# Patient Record
Sex: Female | Born: 1995 | Race: Black or African American | Hispanic: No | Marital: Married | Smoking: Never smoker
Health system: Southern US, Community
[De-identification: ages and names within clinical notes are randomized; demographics above are authoritative.]

## PROBLEM LIST (undated history)

## (undated) DIAGNOSIS — Z789 Other specified health status: Secondary | ICD-10-CM

## (undated) DIAGNOSIS — N83209 Unspecified ovarian cyst, unspecified side: Secondary | ICD-10-CM

## (undated) HISTORY — PX: NO PAST SURGERIES: SHX2092

## (undated) HISTORY — DX: Unspecified ovarian cyst, unspecified side: N83.209

---

## 2016-04-05 NOTE — L&D Delivery Note (Signed)
Delivery Note At 12:36 AM a viable female was delivered via  (Presentation: DOA;  ).  APGAR: , ; weight  . pnding  Placenta status: ,ntact, spontaneous .  Cord:  with the following complications: .none  Cord pH: n/a  Anesthesia:  epidural Episiotomy:  none Lacerations:  none Suture Repair: n/a Est. Blood Loss (mL):  250cc  Mom to postpartum.  Baby to Couplet care / Skin to Skin.  Cassidy Tashiro A. 02/04/2017, 12:51 AM

## 2016-06-21 ENCOUNTER — Ambulatory Visit (INDEPENDENT_AMBULATORY_CARE_PROVIDER_SITE_OTHER): Payer: 59 | Admitting: Emergency Medicine

## 2016-06-21 VITALS — BP 135/84 | HR 89 | Temp 99.2°F | Ht 66.0 in | Wt 115.6 lb

## 2016-06-21 DIAGNOSIS — Z3201 Encounter for pregnancy test, result positive: Secondary | ICD-10-CM | POA: Insufficient documentation

## 2016-06-21 DIAGNOSIS — R112 Nausea with vomiting, unspecified: Secondary | ICD-10-CM | POA: Diagnosis not present

## 2016-06-21 DIAGNOSIS — O210 Mild hyperemesis gravidarum: Secondary | ICD-10-CM | POA: Diagnosis not present

## 2016-06-21 DIAGNOSIS — K08409 Partial loss of teeth, unspecified cause, unspecified class: Secondary | ICD-10-CM

## 2016-06-21 DIAGNOSIS — K0889 Other specified disorders of teeth and supporting structures: Secondary | ICD-10-CM | POA: Diagnosis not present

## 2016-06-21 LAB — POCT URINALYSIS DIP (MANUAL ENTRY)
Bilirubin, UA: NEGATIVE
Blood, UA: NEGATIVE
GLUCOSE UA: NEGATIVE
Nitrite, UA: NEGATIVE
SPEC GRAV UA: 1.015 (ref 1.030–1.035)
UROBILINOGEN UA: 0.2 (ref ?–2.0)
pH, UA: 7 (ref 5.0–8.0)

## 2016-06-21 LAB — POCT URINE PREGNANCY: PREG TEST UR: POSITIVE — AB

## 2016-06-21 MED ORDER — ONDANSETRON HCL 4 MG PO TABS: 4.0000 mg | ORAL_TABLET | Freq: Three times a day (TID) | ORAL | 0 refills | Status: DC | PRN

## 2016-06-21 MED ORDER — AMOXICILLIN 500 MG PO CAPS: 500.0000 mg | ORAL_CAPSULE | Freq: Three times a day (TID) | ORAL | 0 refills | Status: AC

## 2016-06-21 MED ORDER — ONDANSETRON 4 MG PO TBDP
4.0000 mg | ORAL_TABLET | Freq: Once | ORAL | Status: AC
  Administered 2016-06-21: 4 mg via ORAL

## 2016-06-21 NOTE — Progress Notes (Signed)
Marisa Abdoul [name] 20 y.o.   Chief Complaint  Patient presents with  . Pregnant Test    X 1 week oh nausea, and vomiting  . Dental Pain    X 4-5 days left side with headache    HISTORY OF PRESENT ILLNESS: This is a 21 y.o. female complaining of 1 week h/o nausea and vomiting, may be pregnant; also had dental extraction several days ago and now c/o pain to left lower site. Sharp constant pain with radiation to left neck.  HPI   Prior to Admission medications   Medication Sig Start Date End Date Taking? Authorizing Provider  acetaminophen-codeine (TYLENOL #3) 300-30 MG tablet Take by mouth every 4 (four) hours as needed for moderate pain.   Yes Historical Provider, MD  ondansetron (ZOFRAN) 4 MG tablet Take 1 tablet (4 mg total) by mouth every 8 (eight) hours as needed for nausea or vomiting. 06/21/16   Selia Wareing Victorino December, MD    No Known Allergies  There are no active problems to display for this patient.   History reviewed. No pertinent past medical history.  History reviewed. No pertinent surgical history.  Social History   Social History  . Marital status: Married    Spouse name: N/A  . Number of children: N/A  . Years of education: N/A   Occupational History  . Not on file.   Social History Main Topics  . Smoking status: Never Smoker  . Smokeless tobacco: Never Used  . Alcohol use No  . Drug use: No  . Sexual activity: Not on file   Other Topics Concern  . Not on file   Social History Narrative  . No narrative on file    Family History  Problem Relation Age of Onset  . Hypertension Maternal Grandmother   . Hypertension Maternal Grandfather      Review of Systems  Constitutional: Negative for chills and fever.  HENT: Positive for ear pain. Negative for congestion, nosebleeds, sinus pain and sore throat.        Toothache  Eyes: Negative for blurred vision, double vision, discharge and redness.  Respiratory: Negative for cough and shortness of  breath.   Cardiovascular: Negative for chest pain and palpitations.  Gastrointestinal: Positive for nausea and vomiting. Negative for abdominal pain, blood in stool and diarrhea.  Genitourinary: Negative for dysuria, frequency and hematuria.  Musculoskeletal: Negative for myalgias and neck pain.  Neurological: Negative for dizziness and headaches.  All other systems reviewed and are negative.   Vitals:   06/21/16 1600  BP: 135/84  Pulse: 89  Temp: 99.2 F (37.3 C)    Physical Exam  Constitutional: She is oriented to person, place, and time. She appears well-developed and well-nourished.  HENT:  Head: Normocephalic and atraumatic.  Right Ear: External ear normal.  Left Ear: External ear normal.  Nose: Nose normal.  Mouth/Throat: Oropharynx is clear and moist.    s/p tooth extraction with signs of infection  Eyes: Conjunctivae and EOM are normal. Pupils are equal, round, and reactive to light.  Neck: Normal range of motion. Neck supple.  Cardiovascular: Normal rate, regular rhythm and normal heart sounds.   Pulmonary/Chest: Effort normal and breath sounds normal.  Abdominal: Soft. Bowel sounds are normal. There is no tenderness.  Musculoskeletal: Normal range of motion.  Lymphadenopathy:    She has cervical adenopathy.  Neurological: She is alert and oriented to person, place, and time. No sensory deficit. She exhibits normal muscle tone.  Skin: Skin is  warm and dry. Capillary refill takes less than 2 seconds. No rash noted.  Psychiatric: She has a normal mood and affect. Her behavior is normal.  Vitals reviewed.    ASSESSMENT & PLAN: Marisa Pearson was seen today for pregnant test and dental pain.  Diagnoses and all orders for this visit:  Nausea and vomiting, intractability of vomiting not specified, unspecified vomiting type -     POCT urinalysis dipstick -     POCT urine pregnancy -     ondansetron (ZOFRAN-ODT) disintegrating tablet 4 mg; Take 1 tablet (4 mg total) by  mouth once.  Toothache  Positive pregnancy test -     Ambulatory referral to Obstetrics / Gynecology  Status post tooth extraction  Hyperemesis gravidarum -     Ambulatory referral to Obstetrics / Gynecology  Other orders -     ondansetron (ZOFRAN) 4 MG tablet; Take 1 tablet (4 mg total) by mouth every 8 (eight) hours as needed for nausea or vomiting. -     amoxicillin (AMOXIL) 500 MG capsule; Take 1 capsule (500 mg total) by mouth 3 (three) times daily.    Patient Instructions       IF you received an x-ray today, you will receive an invoice from [facility]. Please contact [facility] at [phone] with questions or concerns regarding your invoice.   IF you received labwork today, you will receive an invoice from [facility]. Please contact [facility] at [phone] with questions or concerns regarding your invoice.   Our billing staff will not be able to assist you with questions regarding bills from these companies.  You will be contacted with the lab results as soon as they are available. The fastest way to get your results is to activate your My Chart account. Instructions are located on the last page of this paperwork. If you have not heard from [place] regarding the results in 2 weeks, please contact this office.      Dental Pain Dental pain may be caused by many things, including:  Tooth decay (cavities or caries). Cavities cause the nerve of your tooth to be open to air and hot or cold temperatures. This can cause pain or discomfort.  Abscess or infection. A dental abscess is an area that is full of infected pus from a bacterial infection in the inner part of the tooth (pulp). It usually happens at the end of the tooth's root.  Injury.  An unknown reason (idiopathic). Your pain may be mild or severe. It may only happen when:  You are chewing.  You are exposed to hot or cold temperature.  You are eating or drinking sugary foods or beverages, such  as:  Soda.  Candy. Your pain may also be there all of the time. Follow these instructions at home: Watch your dental pain for any changes. Do these things to lessen your discomfort:  Take medicines only as told by your dentist.  If your dentist tells you to take an antibiotic medicine, finish all of it even if you start to feel better.  Keep all follow-up visits as told by your dentist. This is important.  Do not apply heat to the outside of your face.  Rinse your mouth or gargle with salt water if told by your dentist. This helps with pain and swelling.  You can make salt water by adding  tsp of salt to 1 cup of warm water.  Apply ice to the painful area of your face:  Put ice in a plastic  bag.  Place a towel between your skin and the bag.  Leave the ice on for 20 minutes, 2-3 times per day.  Avoid foods or drinks that cause you pain, such as:  Very hot or very cold foods or drinks.  Sweet or sugary foods or drinks. Contact a doctor if:  Your pain is not helped with medicines.  Your symptoms are worse.  You have new symptoms. Get help right away if:  You cannot open your mouth.  You are having trouble breathing or swallowing.  You have a fever.  Your face, neck, or jaw is puffy (swollen). This information is not intended to replace advice given to you by your health care provider. Make sure you discuss any questions you have with your health care provider. Document Released: 09/08/2007 Document Revised: 08/28/2015 Document Reviewed: 03/18/2014 Elsevier Interactive Patient Education  2017 Elsevier Inc.  Hyperemesis Gravidarum Hyperemesis gravidarum is a severe form of nausea and vomiting that happens during pregnancy. Hyperemesis is worse than morning sickness. It may cause you to have nausea or vomiting all day for many days. It may keep you from eating and drinking enough food and liquids. Hyperemesis usually occurs during the first half (the first 20 weeks)  of pregnancy. It often goes away once a woman is in her second half of pregnancy. However, sometimes hyperemesis continues through an entire pregnancy. What are the causes? The cause of this condition is not known. It may be related to changes in chemicals (hormones) in the body during pregnancy, such as the high level of pregnancy hormone (human chorionic gonadotropin) or the increase in the female sex hormone (estrogen). What are the signs or symptoms? Symptoms of this condition include:  Severe nausea and vomiting.  Nausea that does not go away.  Vomiting that does not allow you to keep any food down.  Weight loss.  Body fluid loss (dehydration).  Having no desire to eat, or not liking food that you have previously enjoyed. How is this diagnosed? This condition may be diagnosed based on:  A physical exam.  Your medical history.  Your symptoms.  Blood tests.  Urine tests. How is this treated? This condition may be managed with medicine. If medicines to do not help relieve nausea and vomiting, you may need to receive fluids through an IV tube at the hospital. Follow these instructions at home:  Take over-the-counter and prescription medicines only as told by your health care provider.  Avoid iron pills and multivitamins that contain iron for the first 3-4 months of pregnancy. If you take prescription iron pills, do not stop taking them unless your health care provider approves.  Take the following actions to help prevent nausea and vomiting:  In the morning, before getting out of bed, try eating a couple of dry crackers or a piece of toast.  Avoid foods and smells that upset your stomach. Fatty and spicy foods may make nausea worse.  Eat 5-6 small meals a day.  Do not drink fluids while eating meals. Drink between meals.  Eat or suck on things that have ginger in them. Ginger can help relieve nausea.  Avoid food preparation. The smell of food can spoil your appetite  or trigger nausea.  Follow instructions from your health care provider about eating or drinking restrictions.  For snacks, eat high-protein foods, such as cheese.  Keep all follow-up and pre-birth (prenatal) visits as told by your health care provider. This is important. Contact a health care provider if:  You have pain in your abdomen.  You have a severe headache.  You have vision problems.  You are losing weight. Get help right away if:  You cannot drink fluids without vomiting.  You vomit blood.  You have constant nausea and vomiting.  You are very weak.  You are very thirsty.  You feel dizzy.  You faint.  You have a fever or other symptoms that last for more than 2-3 days.  You have a fever and your symptoms suddenly get worse. Summary  Hyperemesis gravidarum is a severe form of nausea and vomiting that happens during pregnancy.  Making some changes to your eating habits may help relieve nausea and vomiting.  This condition may be managed with medicine.  If medicines to do not help relieve nausea and vomiting, you may need to receive fluids through an IV tube at the hospital. This information is not intended to replace advice given to you by your health care provider. Make sure you discuss any questions you have with your health care provider. Document Released: 03/22/2005 Document Revised: 11/19/2015 Document Reviewed: 11/19/2015 Elsevier Interactive Patient Education  2017 Elsevier Inc.      Edwina Barth, MD [facility]

## 2016-06-21 NOTE — Patient Instructions (Addendum)
IF you received an x-ray today, you will receive an invoice from [facility]. Please contact [facility] at [phone] with questions or concerns regarding your invoice.   IF you received labwork today, you will receive an invoice from [facility]. Please contact [facility] at [phone] with questions or concerns regarding your invoice.   Our billing staff will not be able to assist you with questions regarding bills from these companies.  You will be contacted with the lab results as soon as they are available. The fastest way to get your results is to activate your My Chart account. Instructions are located on the last page of this paperwork. If you have not heard from [place] regarding the results in 2 weeks, please contact this office.      Dental Pain Dental pain may be caused by many things, including:  Tooth decay (cavities or caries). Cavities cause the nerve of your tooth to be open to air and hot or cold temperatures. This can cause pain or discomfort.  Abscess or infection. A dental abscess is an area that is full of infected pus from a bacterial infection in the inner part of the tooth (pulp). It usually happens at the end of the tooth's root.  Injury.  An unknown reason (idiopathic). Your pain may be mild or severe. It may only happen when:  You are chewing.  You are exposed to hot or cold temperature.  You are eating or drinking sugary foods or beverages, such as:  Soda.  Candy. Your pain may also be there all of the time. Follow these instructions at home: Watch your dental pain for any changes. Do these things to lessen your discomfort:  Take medicines only as told by your dentist.  If your dentist tells you to take an antibiotic medicine, finish all of it even if you start to feel better.  Keep all follow-up visits as told by your dentist. This is important.  Do not apply heat to the outside of your face.  Rinse your mouth or gargle  with salt water if told by your dentist. This helps with pain and swelling.  You can make salt water by adding  tsp of salt to 1 cup of warm water.  Apply ice to the painful area of your face:  Put ice in a plastic bag.  Place a towel between your skin and the bag.  Leave the ice on for 20 minutes, 2-3 times per day.  Avoid foods or drinks that cause you pain, such as:  Very hot or very cold foods or drinks.  Sweet or sugary foods or drinks. Contact a doctor if:  Your pain is not helped with medicines.  Your symptoms are worse.  You have new symptoms. Get help right away if:  You cannot open your mouth.  You are having trouble breathing or swallowing.  You have a fever.  Your face, neck, or jaw is puffy (swollen). This information is not intended to replace advice given to you by your health care provider. Make sure you discuss any questions you have with your health care provider. Document Released: 09/08/2007 Document Revised: 08/28/2015 Document Reviewed: 03/18/2014 Elsevier Interactive Patient Education  2017 Elsevier Inc.  Hyperemesis Gravidarum Hyperemesis gravidarum is a severe form of nausea and vomiting that happens during pregnancy. Hyperemesis is worse than morning sickness. It may cause you to have nausea or vomiting all day for many days. It may keep you from eating and drinking enough food and  liquids. Hyperemesis usually occurs during the first half (the first 20 weeks) of pregnancy. It often goes away once a woman is in her second half of pregnancy. However, sometimes hyperemesis continues through an entire pregnancy. What are the causes? The cause of this condition is not known. It may be related to changes in chemicals (hormones) in the body during pregnancy, such as the high level of pregnancy hormone (human chorionic gonadotropin) or the increase in the female sex hormone (estrogen). What are the signs or symptoms? Symptoms of this condition  include:  Severe nausea and vomiting.  Nausea that does not go away.  Vomiting that does not allow you to keep any food down.  Weight loss.  Body fluid loss (dehydration).  Having no desire to eat, or not liking food that you have previously enjoyed. How is this diagnosed? This condition may be diagnosed based on:  A physical exam.  Your medical history.  Your symptoms.  Blood tests.  Urine tests. How is this treated? This condition may be managed with medicine. If medicines to do not help relieve nausea and vomiting, you may need to receive fluids through an IV tube at the hospital. Follow these instructions at home:  Take over-the-counter and prescription medicines only as told by your health care provider.  Avoid iron pills and multivitamins that contain iron for the first 3-4 months of pregnancy. If you take prescription iron pills, do not stop taking them unless your health care provider approves.  Take the following actions to help prevent nausea and vomiting:  In the morning, before getting out of bed, try eating a couple of dry crackers or a piece of toast.  Avoid foods and smells that upset your stomach. Fatty and spicy foods may make nausea worse.  Eat 5-6 small meals a day.  Do not drink fluids while eating meals. Drink between meals.  Eat or suck on things that have ginger in them. Ginger can help relieve nausea.  Avoid food preparation. The smell of food can spoil your appetite or trigger nausea.  Follow instructions from your health care provider about eating or drinking restrictions.  For snacks, eat high-protein foods, such as cheese.  Keep all follow-up and pre-birth (prenatal) visits as told by your health care provider. This is important. Contact a health care provider if:  You have pain in your abdomen.  You have a severe headache.  You have vision problems.  You are losing weight. Get help right away if:  You cannot drink fluids  without vomiting.  You vomit blood.  You have constant nausea and vomiting.  You are very weak.  You are very thirsty.  You feel dizzy.  You faint.  You have a fever or other symptoms that last for more than 2-3 days.  You have a fever and your symptoms suddenly get worse. Summary  Hyperemesis gravidarum is a severe form of nausea and vomiting that happens during pregnancy.  Making some changes to your eating habits may help relieve nausea and vomiting.  This condition may be managed with medicine.  If medicines to do not help relieve nausea and vomiting, you may need to receive fluids through an IV tube at the hospital. This information is not intended to replace advice given to you by your health care provider. Make sure you discuss any questions you have with your health care provider. Document Released: 03/22/2005 Document Revised: 11/19/2015 Document Reviewed: 11/19/2015 Elsevier Interactive Patient Education  2017 [employer]

## 2016-07-22 LAB — OB RESULTS CONSOLE RPR: RPR: NONREACTIVE

## 2016-07-22 LAB — OB RESULTS CONSOLE GC/CHLAMYDIA
Chlamydia: NEGATIVE
Gonorrhea: NEGATIVE

## 2016-07-22 LAB — OB RESULTS CONSOLE ABO/RH: RH TYPE: POSITIVE

## 2016-07-22 LAB — OB RESULTS CONSOLE HEPATITIS B SURFACE ANTIGEN: Hepatitis B Surface Ag: NEGATIVE

## 2016-07-22 LAB — OB RESULTS CONSOLE RUBELLA ANTIBODY, IGM: Rubella: IMMUNE

## 2016-07-22 LAB — OB RESULTS CONSOLE ANTIBODY SCREEN: ANTIBODY SCREEN: NEGATIVE

## 2016-07-22 LAB — OB RESULTS CONSOLE HIV ANTIBODY (ROUTINE TESTING): HIV: NONREACTIVE

## 2016-08-18 ENCOUNTER — Ambulatory Visit (HOSPITAL_COMMUNITY)
Admission: RE | Admit: 2016-08-18 | Discharge: 2016-08-18 | Disposition: A | Discharge status: Home / Self Care | Payer: 59 | Source: Ambulatory Visit | Attending: Obstetrics | Admitting: Obstetrics

## 2016-08-18 DIAGNOSIS — Z3A14 14 weeks gestation of pregnancy: Secondary | ICD-10-CM | POA: Insufficient documentation

## 2016-08-18 DIAGNOSIS — O352XX0 Maternal care for (suspected) hereditary disease in fetus, not applicable or unspecified: Secondary | ICD-10-CM | POA: Insufficient documentation

## 2016-08-18 NOTE — Progress Notes (Signed)
Genetic Counseling  Visit Summary Note  Appointment Date: 08/18/2016 Referred By: Aloha Gell, MD  Date of Birth: 07-22-1995  I met with [name] and her husband, Marisa Pearson, for genetic counseling because of a family history of sickle cell anemia.  Both Mr. Marisa Pearson and the interpreter provided English/French translation.  In summary:  Discussed family history of sickle cell anemia  Reviewed information about sickle cell anemia  Discussed diagnostic testing options  Declined amniocentesis  Reviewed other family history concerns  None reported  Discussed general population carrier screening options - declined  CF  SMA  We began by reviewing the family history in detail. 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  While the laboratory report was not available for her husband's test results, notes from the patient's medical record suggested that her husband was also identified as a carrier for sickle cell anemia.  We discussed that Sickle Cell anemia (SCA) is a hemoglobinopathy in which there is an inherited structural abnormality in one of the globin chains, specifically the beta globin chain.  It is characterized by episodes of vaso-occlusive crisis and chronic anemia due to the tendency of red blood cells to become deformed under conditions of decreased oxygen tension.  The basic defect in SCA involves the change of a single amino acid altering the configuration of the hemoglobin molecule causing the cells to sickle and to obstruct blood flow in small vessels.  Ischemia of tissues and organs results.  Increased cell fragility with increased phagocytosis and splenic sequestration of the fragile cells produces anemia.  SCA (Hg SS) is inherited as an autosomal recessive condition.  The carrier state is Hg AS.    This couple was counseled that every person is a carrier for  approximately 8-10 different recessive conditions.  Typically carriers are not symptomatic and are only identified as such when they have carrier screening or an affected child.  We discussed that when both parents are carriers for sickle cell anemia (HgAS) there is a 1 in 4 (25%) chance with each pregnancy to have a child with sickle cell disease (HgSS).  There is also a 1 in 4 chance to have a child who is not a carrier or affected, and a 1 in 2 chance to have a child who is a carrier like the parents.   We reviewed that early diagnosis of SCA, and other disorders of hemoglobin, are facilitated by newborn screening before the onset of symptoms.  Clinical manifestations usually present in the first or second year of life.  We discussed the option of amniocentesis as a way to determine, prenatally, if a baby has SCA or not, when both parents are known carriers.  A risk of 1 in 300-500 was given for amniocentesis, the primary complication being spontaneous pregnancy loss, or at this late gestation, preterm delivery.  She indicated that she understood these risks but declined amniocentesis. She understands that although the ultrasound may appear normal, the risk of anomalies cannot be completely eliminated, and that a baby with SCA would not appear different on a prenatal ultrasound. The patient was advised of this limitation and states she still does not want the amniocentesis.  The family histories were otherwise found to be noncontributory for birth defects, mental retardation, and known genetic conditions. Without further information regarding the provided family history, an accurate genetic risk cannot be calculated. Further genetic counseling is warranted if more information  is obtained.  [name] was provided with written information regarding cystic fibrosis (CF), spinal muscular atrophy (SMA) and hemoglobinopathies including the carrier frequency, availability of carrier screening and  prenatal diagnosis if indicated.  In addition, we discussed that CF and hemoglobinopathies are routinely screened for as part of the [place] newborn screening panel.  After further discussion, she declined screening for CF and SMA.  [name] denied exposure to environmental toxins or chemical agents. She denied the use of alcohol, tobacco or street drugs. She denied significant viral illnesses during the course of her pregnancy. Her medical and surgical histories were noncontributory.   I counseled this couple regarding the above risks and available options.  The approximate face-to-face time with the genetic counselor was 40 minutes.   Cam Hai, MS Certified Genetic Counselor

## 2016-08-20 ENCOUNTER — Other Ambulatory Visit: Payer: Self-pay

## 2017-01-21 LAB — OB RESULTS CONSOLE GBS: STREP GROUP B AG: NEGATIVE

## 2017-02-03 ENCOUNTER — Encounter (HOSPITAL_COMMUNITY): Payer: Self-pay | Admitting: *Deleted

## 2017-02-03 ENCOUNTER — Inpatient Hospital Stay (HOSPITAL_COMMUNITY)
Admission: AD | Admit: 2017-02-03 | Discharge: 2017-02-06 | DRG: 807 | Disposition: A | Discharge status: Home / Self Care | Payer: 59 | Source: Ambulatory Visit | Attending: Obstetrics | Admitting: Obstetrics

## 2017-02-03 ENCOUNTER — Inpatient Hospital Stay (HOSPITAL_COMMUNITY): Payer: 59 | Admitting: Anesthesiology

## 2017-02-03 DIAGNOSIS — D573 Sickle-cell trait: Secondary | ICD-10-CM | POA: Diagnosis present

## 2017-02-03 DIAGNOSIS — Z3A38 38 weeks gestation of pregnancy: Secondary | ICD-10-CM | POA: Diagnosis not present

## 2017-02-03 DIAGNOSIS — Z3483 Encounter for supervision of other normal pregnancy, third trimester: Secondary | ICD-10-CM | POA: Diagnosis present

## 2017-02-03 HISTORY — DX: Other specified health status: Z78.9

## 2017-02-03 LAB — CBC
HCT: 38.9 % (ref 36.0–46.0)
HEMOGLOBIN: 13.3 g/dL (ref 12.0–15.0)
MCH: 27.3 pg (ref 26.0–34.0)
MCHC: 34.2 g/dL (ref 30.0–36.0)
MCV: 79.7 fL (ref 78.0–100.0)
Platelets: 134 10*3/uL — ABNORMAL LOW (ref 150–400)
RBC: 4.88 MIL/uL (ref 3.87–5.11)
RDW: 14.7 % (ref 11.5–15.5)
WBC: 10 10*3/uL (ref 4.0–10.5)

## 2017-02-03 LAB — ABO/RH: ABO/RH(D): O POS

## 2017-02-03 LAB — TYPE AND SCREEN
ABO/RH(D): O POS
Antibody Screen: NEGATIVE

## 2017-02-03 MED ORDER — LACTATED RINGERS IV SOLN
500.0000 mL | Freq: Once | INTRAVENOUS | Status: AC
  Administered 2017-02-03: 500 mL via INTRAVENOUS

## 2017-02-03 MED ORDER — SOD CITRATE-CITRIC ACID 500-334 MG/5ML PO SOLN: 30.0000 mL | ORAL | Status: DC | PRN

## 2017-02-03 MED ORDER — LIDOCAINE HCL (PF) 1 % IJ SOLN
INTRAMUSCULAR | Status: DC | PRN
  Administered 2017-02-03 (×3): 4 mL via EPIDURAL

## 2017-02-03 MED ORDER — OXYCODONE-ACETAMINOPHEN 5-325 MG PO TABS: 1.0000 | ORAL_TABLET | ORAL | Status: DC | PRN

## 2017-02-03 MED ORDER — LIDOCAINE HCL (PF) 1 % IJ SOLN
30.0000 mL | INTRAMUSCULAR | Status: DC | PRN
  Filled 2017-02-03: qty 30

## 2017-02-03 MED ORDER — DIPHENHYDRAMINE HCL 50 MG/ML IJ SOLN: 12.5000 mg | INTRAMUSCULAR | Status: DC | PRN

## 2017-02-03 MED ORDER — PHENYLEPHRINE 40 MCG/ML (10ML) SYRINGE FOR IV PUSH (FOR BLOOD PRESSURE SUPPORT)
80.0000 ug | PREFILLED_SYRINGE | INTRAVENOUS | Status: DC | PRN
  Filled 2017-02-03: qty 5

## 2017-02-03 MED ORDER — OXYCODONE-ACETAMINOPHEN 5-325 MG PO TABS: 2.0000 | ORAL_TABLET | ORAL | Status: DC | PRN

## 2017-02-03 MED ORDER — OXYTOCIN BOLUS FROM INFUSION
500.0000 mL | Freq: Once | INTRAVENOUS | Status: AC
  Administered 2017-02-04: 500 mL via INTRAVENOUS

## 2017-02-03 MED ORDER — ONDANSETRON HCL 4 MG/2ML IJ SOLN: 4.0000 mg | Freq: Four times a day (QID) | INTRAMUSCULAR | Status: DC | PRN

## 2017-02-03 MED ORDER — EPHEDRINE 5 MG/ML INJ
10.0000 mg | INTRAVENOUS | Status: DC | PRN
  Filled 2017-02-03: qty 2

## 2017-02-03 MED ORDER — OXYTOCIN 40 UNITS IN LACTATED RINGERS INFUSION - SIMPLE MED
2.5000 [IU]/h | INTRAVENOUS | Status: DC
  Filled 2017-02-03: qty 1000

## 2017-02-03 MED ORDER — ACETAMINOPHEN 325 MG PO TABS: 650.0000 mg | ORAL_TABLET | ORAL | Status: DC | PRN

## 2017-02-03 MED ORDER — PHENYLEPHRINE 40 MCG/ML (10ML) SYRINGE FOR IV PUSH (FOR BLOOD PRESSURE SUPPORT)
80.0000 ug | PREFILLED_SYRINGE | INTRAVENOUS | Status: DC | PRN
  Filled 2017-02-03: qty 5
  Filled 2017-02-03: qty 10

## 2017-02-03 MED ORDER — LACTATED RINGERS IV SOLN: 500.0000 mL | INTRAVENOUS | Status: DC | PRN

## 2017-02-03 MED ORDER — LACTATED RINGERS IV SOLN
INTRAVENOUS | Status: DC
  Administered 2017-02-03 (×3): via INTRAVENOUS

## 2017-02-03 MED ORDER — FENTANYL 2.5 MCG/ML BUPIVACAINE 1/10 % EPIDURAL INFUSION (WH - ANES)
14.0000 mL/h | INTRAMUSCULAR | Status: DC | PRN
  Administered 2017-02-03: 14 mL/h via EPIDURAL
  Filled 2017-02-03: qty 100

## 2017-02-03 NOTE — Anesthesia Pain Management Evaluation Note (Signed)
  CRNA Pain Management Visit Note  Patient: Marisa Pearson, 21 y.o., female  "Hello I am a member of the anesthesia team at [facility]. We have an anesthesia team available at all times to provide care throughout the hospital, including epidural management and anesthesia for C-section. I don't know your plan for the delivery whether it a natural birth, water birth, IV sedation, nitrous supplementation, doula or epidural, but we want to meet your pain goals."   1.Was your pain managed to your expectations on prior hospitalizations?   No prior hospitalizations  2.What is your expectation for pain management during this hospitalization?     Labor support without medications  3.How can we help you reach that goal? Be available. Patient desires nature child birth verified by RN  Record the patient's initial score and the patient's pain goal.   Pain: 10  Pain Goal: 10 [facility] wants you to be able to say your pain was always managed very well.  River[facility] Asc LLCMERRITT,Ashby Leflore 02/03/2017

## 2017-02-03 NOTE — Anesthesia Preprocedure Evaluation (Signed)

## 2017-02-03 NOTE — Anesthesia Procedure Notes (Signed)
Epidural Patient location during procedure: OB Start time: 02/03/2017 8:47 PM  Staffing Anesthesiologist: Karna ChristmasELLENDER, Akilah Cureton P Performed: anesthesiologist   Preanesthetic Checklist Completed: patient identified, site marked, pre-op evaluation, timeout performed, IV checked, risks and benefits discussed and monitors and equipment checked  Epidural Patient position: sitting Prep: DuraPrep Patient monitoring: heart rate, cardiac monitor, continuous pulse ox and blood pressure Approach: midline Location: L4-L5 Injection technique: LOR air  Needle:  Needle type: Tuohy  Needle gauge: 17 G Needle length: 9 cm Needle insertion depth: 4 cm Catheter type: closed end flexible Catheter size: 19 Gauge Catheter at skin depth: 9 cm Test dose: negative and Other  Assessment Events: blood not aspirated, injection not painful, no injection resistance and negative IV test  Additional Notes Informed consent obtained prior to proceeding including risk of failure, 1% risk of PDPH, risk of minor discomfort and bruising.  Discussed rare but serious complications. Discussed alternatives to epidural analgesia and patient desires to proceed.  Timeout performed pre-procedure verifying patient name, procedure, and platelet count.  Patient tolerated procedure well. Reason for block:procedure for pain

## 2017-02-03 NOTE — H&P (Signed)
Marisa Pearson is a 21 y.o. G1P0 at 8066w4d presenting for active labor. Pt notes onset contractions yesterday am, seen in office at 1cm, continued ctx through night and report no sleep. Repeat exam in offcie today 4cm. No LOF . Good fetal movement, No vaginal bleeding- scant c/w bloody show.   [facility] 10 wks - Dated by LMP c/w 10 wk u/s - patient and FOB carry SS trait, s/p genetic counseling but no further fetal testing done - varicella NI   Prenatal Transfer Tool  Maternal Diabetes: No Genetic Screening: Normal Maternal Ultrasounds/Referrals: Normal Fetal Ultrasounds or other Referrals:  None Maternal Substance Abuse:  No Significant Maternal Medications:  None Significant Maternal Lab Results: None     OB History    Gravida Para Term Preterm AB Living   1             SAB TAB Ectopic Multiple Live Births                 Past Medical History:  Diagnosis Date  . Medical history non-contributory    Past Surgical History:  Procedure Laterality Date  . NO PAST SURGERIES     Family History: family history includes Hypertension in her maternal grandfather and maternal grandmother. Social History:  reports that she has never smoked. She has never used smokeless tobacco. She reports that she does not drink alcohol or use drugs.  Review of Systems - Negative except painful contractions     Blood pressure 119/76, pulse (!) 101, temperature 98.9 F (37.2 C), temperature source Oral, resp. rate 18, height 5\' 6"  (1.676 m), weight 135 lb (61.2 kg), last menstrual period 05/09/2016.  Physical Exam:  Gen: well appearing, no distress Back: no CVAT Abd: gravid, NT, no RUQ pain LE: no edema, equal bilaterally, non-tender Toco: q 4-6 in office, now q 10 FH: baseline 140s, accelerations present, no deceleratons, 10 beat variability  Prenatal labs: ABO, Rh: O/Positive/-- (04/19 0000) Antibody: Negative (04/19 0000) Rubella: Immune (04/19 0000) RPR:  Nonreactive (04/19 0000)  HBsAg: Negative (04/19 0000)  HIV: Non-reactive (04/19 0000)  GBS: Negative (10/19 0000)  1 hr Glucola 80   Genetic screening nl quad Anatomy US normal   Assessment/Plan: 21 y.o. G1P0 at 3866w4d Active labor at term, now with some spacing of ctx, if no continued cervical change will plan pit/ AROM GBS neg Test baby for SS Varicella vaccination PP   Thailan Sava A. 02/03/2017, 3:47 PM

## 2017-02-03 NOTE — Progress Notes (Signed)
S: Doing well, no complaints, pain well controlled without epidural, unsure if planning  O: BP 129/74   Pulse (!) 101   Temp (!) 97.5 F (36.4 C) (Oral)   Resp 16   Ht 5\' 6"  (1.676 m)   Wt 135 lb (61.2 kg)   LMP 05/09/2016 (Approximate)   BMI 21.79 kg/m    FHT:  FHR: 145s bpm, variability: moderate,  accelerations:  Present,  decelerations:  Absent UC:   regular, every 8 minutes SVE:   Dilation: 4 Effacement (%): 50 Exam by:: Dr. Ernestina PennaFogleman  AROM clear  A / P:  21 y.o.  Obstetric History   G1   P0   T0   P0   A0   L0    SAB0   TAB0   Ectopic0   Multiple0   Live Births0    at 3110w4d spont labor, now with AROM augmentation  Fetal Wellbeing:  Category I Pain Control:  Labor support without medications  Anticipated MOD:  NSVD  Marisa Pearson A. 02/03/2017, 5:08 PM

## 2017-02-04 ENCOUNTER — Encounter (HOSPITAL_COMMUNITY): Payer: Self-pay | Admitting: *Deleted

## 2017-02-04 LAB — RPR: RPR: NONREACTIVE

## 2017-02-04 MED ORDER — DIBUCAINE 1 % RE OINT: 1.0000 "application " | TOPICAL_OINTMENT | RECTAL | Status: DC | PRN

## 2017-02-04 MED ORDER — PRENATAL MULTIVITAMIN CH
1.0000 | ORAL_TABLET | Freq: Every day | ORAL | Status: DC
  Administered 2017-02-04 – 2017-02-06 (×3): 1 via ORAL
  Filled 2017-02-04 (×3): qty 1

## 2017-02-04 MED ORDER — ZOLPIDEM TARTRATE 5 MG PO TABS
5.0000 mg | ORAL_TABLET | Freq: Every evening | ORAL | Status: DC | PRN
Start: 2017-02-04 — End: 2017-02-06

## 2017-02-04 MED ORDER — SENNOSIDES-DOCUSATE SODIUM 8.6-50 MG PO TABS
2.0000 | ORAL_TABLET | ORAL | Status: DC
  Administered 2017-02-04 – 2017-02-05 (×2): 2 via ORAL
  Filled 2017-02-04 (×2): qty 2

## 2017-02-04 MED ORDER — COCONUT OIL OIL: 1.0000 "application " | TOPICAL_OIL | Status: DC | PRN

## 2017-02-04 MED ORDER — IBUPROFEN 600 MG PO TABS
600.0000 mg | ORAL_TABLET | Freq: Four times a day (QID) | ORAL | Status: DC
  Administered 2017-02-04 – 2017-02-06 (×9): 600 mg via ORAL
  Filled 2017-02-04 (×9): qty 1

## 2017-02-04 MED ORDER — ACETAMINOPHEN 325 MG PO TABS
650.0000 mg | ORAL_TABLET | ORAL | Status: DC | PRN
  Administered 2017-02-05: 650 mg via ORAL
  Filled 2017-02-04: qty 2

## 2017-02-04 MED ORDER — BENZOCAINE-MENTHOL 20-0.5 % EX AERO: 1.0000 "application " | INHALATION_SPRAY | CUTANEOUS | Status: DC | PRN

## 2017-02-04 MED ORDER — DIPHENHYDRAMINE HCL 25 MG PO CAPS: 25.0000 mg | ORAL_CAPSULE | Freq: Four times a day (QID) | ORAL | Status: DC | PRN

## 2017-02-04 MED ORDER — WITCH HAZEL-GLYCERIN EX PADS: 1.0000 "application " | MEDICATED_PAD | CUTANEOUS | Status: DC | PRN

## 2017-02-04 MED ORDER — OXYCODONE HCL 5 MG PO TABS: 5.0000 mg | ORAL_TABLET | ORAL | Status: DC | PRN

## 2017-02-04 MED ORDER — ONDANSETRON HCL 4 MG/2ML IJ SOLN: 4.0000 mg | INTRAMUSCULAR | Status: DC | PRN

## 2017-02-04 MED ORDER — OXYCODONE HCL 5 MG PO TABS: 10.0000 mg | ORAL_TABLET | ORAL | Status: DC | PRN

## 2017-02-04 MED ORDER — ONDANSETRON HCL 4 MG PO TABS
4.0000 mg | ORAL_TABLET | ORAL | Status: DC | PRN
Start: 2017-02-04 — End: 2017-02-06

## 2017-02-04 MED ORDER — TETANUS-DIPHTH-ACELL PERTUSSIS 5-2.5-18.5 LF-MCG/0.5 IM SUSP: 0.5000 mL | Freq: Once | INTRAMUSCULAR | Status: DC

## 2017-02-04 MED ORDER — SIMETHICONE 80 MG PO CHEW
80.0000 mg | CHEWABLE_TABLET | ORAL | Status: DC | PRN
Start: 2017-02-04 — End: 2017-02-06

## 2017-02-04 NOTE — Lactation Note (Signed)
This note was copied from a baby's chart. Lactation Consultation Note Baby 4 hrs old. Sleepy. Also gagging when moved around. Baby had stool, LC changed stool diaper. Baby gagging while changing diaper. Abd. Very distended.  Mom speaks [place], some [place]. If she doesn't understand she looks at her husband to translate to her. He is her interpreter who speaks very good [place]. Mom answered a lot of questions and asked questions.  Newborn behavior discussed, STS, I&O, cluster feeding. Mom encouraged to feed baby 8-12 times/24 hours and with feeding cues.  Mom has pendulous breast w/everted short shaft compressible nipples. Mom stated she had been leaking some clear liquid from her breast while pregnant. Hand expression demonstrated w/colostrum noted. Discussed waking baby if hasn't cued in 3 hrs. Encouraged to call for assistance in latching or questions. WH/LC brochure given w/resources, support groups and LC services. Patient Name: Marisa PollenBoyLeyla Abdoul Aziz Garba MVHQI'OToday's Date: 02/04/2017 Reason for consult: Initial assessment   Maternal Data Has patient been taught Hand Expression?: Yes  Feeding Feeding Type: Breast Fed Length of feed: 15 min  LATCH Score Latch: Too sleepy or reluctant, no latch achieved, no sucking elicited.  Audible Swallowing: None  Type of Nipple: Everted at rest and after stimulation  Comfort (Breast/Nipple): Soft / non-tender  Hold (Positioning): Assistance needed to correctly position infant at breast and maintain latch.  LATCH Score: 6  Interventions Interventions: Breast feeding basics reviewed;Breast compression;Breast massage;Hand express  Lactation Tools Discussed/Used WIC Program: No   Consult Status Consult Status: Follow-up Date: 02/04/17 Follow-up type: In-patient    Charyl DancerCARVER, Irven Ingalsbe G 02/04/2017, 4:44 AM

## 2017-02-04 NOTE — Anesthesia Postprocedure Evaluation (Signed)
Anesthesia Post Note  Patient: Marisa Pearson  Procedure(s) Performed: AN AD HOC LABOR EPIDURAL     Patient location during evaluation: Mother Baby Anesthesia Type: Epidural Level of consciousness: awake and alert, oriented and patient cooperative Pain management: pain level controlled Vital Signs Assessment: post-procedure vital signs reviewed and stable Respiratory status: spontaneous breathing Cardiovascular status: stable Postop Assessment: no headache, epidural receding, patient able to bend at knees and no signs of nausea or vomiting Anesthetic complications: no Comments: Pain score 2.    Last Vitals:  Vitals:   02/04/17 0345 02/04/17 0815  BP: 101/67 112/61  Pulse: 93 83  Resp: 18 16  Temp: 36.8 C 36.7 C  SpO2:      Last Pain:  Vitals:   02/04/17 0815  TempSrc: Oral  PainSc: 0-No pain   Pain Goal: Patients Stated Pain Goal: 10 (02/03/17 1800)               Merrilyn PumaWRINKLE,Trixy Loyola

## 2017-02-05 NOTE — Lactation Note (Signed)
This note was copied from a baby's chart. Lactation Consultation Note  Patient Name: Marisa PollenBoyLeyla Abdoul Aziz Garba ZOXWR'UToday's Date: 02/05/2017  Baby sleeping in crib post circumcision.  Mom states baby is latching better today.  Instructed to feed baby with cues and call for assist prn.  Mom states shield is no longer needed.   Maternal Data    Feeding Length of feed:  (few sucks.)  LATCH Score                   Interventions    Lactation Tools Discussed/Used     Consult Status      Huston FoleyMOULDEN, Dontre Laduca S 02/05/2017, 1:09 PM

## 2017-02-05 NOTE — Progress Notes (Signed)
Instructed mom and dad on how to use DEBP. Mom and father verbalize understanding and mom is currently using DEBP. Mom will use DEBP after every breastfeeding.

## 2017-02-05 NOTE — Progress Notes (Signed)
PPD # 1 SVD Information for the patient's newborn:  Marisa Pearson, BoyLeyla [[id number]][[id number]]  female      breast feeding  / Circumcision planned Baby name: Marisa Pearson  S:  Reports feeling cramping, tired, up all night with the baby.             Tolerating po/ No nausea or vomiting             Bleeding is light             Pain controlled with ibuprofen (OTC)             Up ad lib / ambulatory / voiding without difficulties        O:  A & O x 3, in no apparent distress              VS:  Vitals:   02/04/17 0815 02/04/17 1459 02/04/17 1746 02/05/17 0528  BP: 112/61 (!) 103/58 99/61 106/68  Pulse: 83 90 86 82  Resp: 16 16 18 18   Temp: 98.1 F (36.7 C) 98.1 F (36.7 C) 98.5 F (36.9 C) 98.9 F (37.2 C)  TempSrc: Oral Oral Oral Oral  SpO2:      Weight:      Height:        LABS:  Recent Labs  02/03/17 1434  WBC 10.0  HGB 13.3  HCT 38.9  PLT 134*    Blood type: --/--/O POS, O POS (11/01 1430)  Rubella: Immune (04/19 0000)   I&O: I/O last 3 completed shifts: In: 240 [P.O.:240] Out: 2250 [Urine:2000; Blood:250]          No intake/output data recorded.  Lungs: Clear and unlabored  Heart: regular rate and rhythm / no murmurs  Abdomen: soft, non-tender, non-distended             Fundus: firm, non-tender, U@  Perineum: intact  Lochia: small  Extremities: no edema, no calf pain or tenderness    A/P: PPD # 1 21 y.o., G1P1001   Principal Problem:   SVD (spontaneous vaginal delivery) 11/2 Active Problems:   Postpartum care following vaginal delivery  Lactation support - latch  Doing well - stable status  Routine post partum orders  Anticipate discharge tomorrow    Neta Mendsaniela C Kennis Buell, MSN, CNM 02/05/2017, 8:47 AM

## 2017-02-05 NOTE — Lactation Note (Signed)
This note was copied from a baby's chart. Lactation Consultation Note Baby hasn't really BF since birth. Very sleepy, spitty. Has had several BM. Abd. Not distended as after delivery.  Mom had rm. Very warm temp 76 on thermostat. LC lowered temp, unwrapped blanket, removed hat and onsie. Stimulated baby to wake. Assessed suck w/gloved finger to stimulate to suck. Suck training w/tongue massage performed.  Mom has pendulous/tubular breast. Nipple flat at rest, everts w/stimulation. Breast compressible, tender upon massage and compression. Mom not tolerating hand expression well d/t tenderness.  In football hold full assist in latching. Taught "C" hold mom kept putting fingers on nipple, then trying to put into baby's mouth. Hand and finger placement several times w/explaination of not touching nipple. Baby unable to latch unless sand which hold and lays nipple back into mouth. Baby tongue thrust nipple out.  Fitted mom w/#20 NS. fitted tight. Fitted #24 NS, felt better. Demonstrated chin tug. Noted clear fluid in NS. ? Baby's salvia or colostrum.  Shells given for mom to wear to assist in everting nipple. Hand pump encouraged to evert prior to latching and placing NS. Hand expressing encouraged to rub colostrum on NS. Reviewed plan and instructions w/FOB when he returned to rm.  Baby more awake and suckling at breast well when LC left.   Patient Name: Marisa Pearson ZOXWR'UToday's Date: 02/05/2017 Reason for consult: Follow-up assessment;Difficult latch   Maternal Data    Feeding Feeding Type: Breast Fed  LATCH Score Latch: Repeated attempts needed to sustain latch, nipple held in mouth throughout feeding, stimulation needed to elicit sucking reflex.  Audible Swallowing: None  Type of Nipple: Flat  Comfort (Breast/Nipple): Filling, red/small blisters or bruises, mild/mod discomfort  Hold (Positioning): Full assist, staff holds infant at breast  LATCH Score:  3  Interventions Interventions: Breast compression;Assisted with latch;Adjust position;Hand pump;Skin to skin;Support pillows;Breast massage;Position options;Hand express;Pre-pump if needed;Shells  Lactation Tools Discussed/Used Tools: Shells;Pump;Nipple Shields Nipple shield size: 24;20 Shell Type: Inverted Breast pump type: Manual   Consult Status Consult Status: Follow-up Date: 02/05/17 Follow-up type: In-patient    Anayla Giannetti, Diamond NickelLAURA G 02/05/2017, 1:00 AM

## 2017-02-06 MED ORDER — IBUPROFEN 600 MG PO TABS: 600.0000 mg | ORAL_TABLET | Freq: Four times a day (QID) | ORAL | 0 refills | Status: DC

## 2017-02-06 MED ORDER — COCONUT OIL OIL: 1.0000 "application " | TOPICAL_OIL | 0 refills | Status: DC | PRN

## 2017-02-06 MED ORDER — BENZOCAINE-MENTHOL 20-0.5 % EX AERO: 1.0000 "application " | INHALATION_SPRAY | CUTANEOUS | Status: DC | PRN

## 2017-02-06 MED ORDER — ACETAMINOPHEN 325 MG PO TABS: 650.0000 mg | ORAL_TABLET | ORAL | Status: DC | PRN

## 2017-02-06 NOTE — Discharge Summary (Signed)
Obstetric Discharge Summary Reason for Admission: onset of labor Prenatal Procedures: ultrasound Intrapartum Procedures: spontaneous vaginal delivery and epidural Postpartum Procedures: none Complications-Operative and Postpartum: none Hemoglobin  Date Value Ref Range Status  02/03/2017 13.3 12.0 - 15.0 g/dL Final   HCT  Date Value Ref Range Status  02/03/2017 38.9 36.0 - 46.0 % Final    Physical Exam:  General: alert, cooperative and no distress Lochia: appropriate Uterine Fundus: firm Incision: na DVT Evaluation: No cords or calf tenderness. No significant calf/ankle edema.  Discharge Diagnoses: Term Pregnancy-delivered  Discharge Information: Date: 02/06/2017 Activity: pelvic rest Diet: routine Medications:  Allergies as of 02/06/2017      Reactions   Peanut-containing Drug Products Swelling   Pork-derived Products Other (See Comments)   Muslim, no pork      Medication List    TAKE these medications   acetaminophen 325 MG tablet Commonly known as:  TYLENOL Take 2 tablets (650 mg total) every 4 (four) hours as needed by mouth (for pain scale < 4).   benzocaine-Menthol 20-0.5 % Aero Commonly known as:  DERMOPLAST Apply 1 application as needed topically for irritation (perineal discomfort).   coconut oil Oil Apply 1 application as needed topically.   diphenhydramine-acetaminophen 25-500 MG Tabs tablet Commonly known as:  TYLENOL PM Take 1 tablet by mouth at bedtime as needed (pain, sleep).   ibuprofen 600 MG tablet Commonly known as:  ADVIL,MOTRIN Take 1 tablet (600 mg total) every 6 (six) hours by mouth.   prenatal multivitamin Tabs tablet Take 1 tablet by mouth daily at 12 noon.            Discharge Care Instructions  (From admission, onward)        Start     Ordered   02/06/17 0000  Discharge wound care:    Comments:  Sitz baths 2 times /day with warm water x 1 week   02/06/17 1031     Condition: stable Instructions: refer to practice  specific booklet Discharge to: home Follow-up Information    Noland FordyceFogleman, Kelly, MD. Schedule an appointment as soon as possible for a visit in 6 week(s).   Specialty:  Obstetrics and Gynecology Contact information: [address] [place] [address] [phone]           Newborn Data: Live born female Nada MaclachlanSalim Birth Weight: 6 lb 8.9 oz (2974 g) APGAR: 9, 9  Newborn Delivery   Birth date/time:  02/04/2017 00:36:00 Delivery type:  Vaginal, Spontaneous     Home with mother.  Neta MendsDaniela C Malissia Rabbani 02/06/2017, 10:31 AM

## 2017-02-06 NOTE — Lactation Note (Signed)
This note was copied from a baby's chart. Lactation Consultation Note  Patient Name: Marisa Pearson XTKWI'O Date: 02/06/2017 Reason for consult: Follow-up assessment  Baby 15 hours old. Mother declined interpretive services, and FOB has signed to interpret for mom. Mom reports that her milk is increasing with pumping. Enc mom to continue to put baby to breast with cues, supplement after with EBM and then post-pump. Mom reports that she has a personal pump (Lansinoh) at home. Mom enc to call private insurance company to discuss getting a pump, aware of [employer] and reviewed how to use piston in kit for manual pumping as well. Mom aware of OP/BFSG and [place] phone line assistance after D/C.   Maternal Data    Feeding    LATCH Score                   Interventions    Lactation Tools Discussed/Used     Consult Status Consult Status: PRN    Andres Labrum 02/06/2017, 10:44 AM

## 2018-09-29 ENCOUNTER — Ambulatory Visit: Payer: Self-pay | Admitting: Surgery

## 2018-09-29 NOTE — H&P (Signed)
History of Present Illness Marisa Pearson(Marisa Pearson K. Marisa Handley Marisa Pearson; 09/29/2018 11:29 AM) The patient is a 23 year old female who presents with a complaint of Breast pain. Referred by Dr. Viviann SpareKelly Fogelman for painful bilateral axillary accessory breast tissue  This is a healthy 23 year old female who presents with swelling and tenderness in both axilla. The patient had her first pregnancy at age 23. During her pregnancy she developed swelling of some accessory breast tissue in each axilla. Both of these areas became very tender. After her pregnancy they swelling and tenderness persisted. Both areas remain tender to palpation although the size of the swelling has gone down. The patient is no longer breast-feeding. She is on oral contraceptives. There has been no improvement since starting oral contraceptives. She has not had any imaging of this area. She has not had any drainage from these areas. She presents now to discuss excision of the bilateral axillary accessory breast tissue.  Menarche age 23 First pregnancy age 23 Breast-feeding yes but she is no longer breast-feeding SYMPTOMS 1 year Family history negative for breast cancer     Problem List/Past Medical Marisa Pearson(Marisa Pearson K. 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 Medications Reconciled  Social History Marisa Pearson(Marisa Pearson, CMA; 09/29/2018 10:00 AM) Caffeine use Coffee, Tea. No alcohol use No drug use Tobacco use Never smoker.  Family History Marisa Pearson(Marisa Pearson, [place]; 09/29/2018 10:00 AM) First Degree  Relatives No pertinent family history  Pregnancy / Birth History Marisa Pearson(Marisa Pearson, [place]; 09/29/2018 10:00 AM) Age at menarche 12 years. Contraceptive History Oral contraceptives. Gravida 1 Irregular periods Length (months) of breastfeeding 7-12 Maternal age 7-25  Other Problems Marisa Pearson(Marisa Pearson K. Dasan Hardman, Marisa Pearson; 09/29/2018 11:27 AM) No pertinent past medical history     Review of Systems Marisa Pearson(Marisa Pearson CMA; 09/29/2018 10:00 AM) General Not Present- Appetite Loss, Chills, Fatigue, Fever, Night Sweats, Weight Gain and Weight Loss. Skin Not Present- Change in Wart/Mole, Dryness, Hives, Jaundice, New Lesions, Non-Healing Wounds, Rash and Ulcer. HEENT Not Present- Earache, Hearing Loss, Hoarseness, Nose Bleed, Oral Ulcers, Ringing in the Ears, Seasonal Allergies, Sinus Pain, Sore Throat, Visual Disturbances, Wears glasses/contact lenses and Yellow Eyes. Respiratory Not Present- Bloody sputum, Chronic Cough, Difficulty Breathing, Snoring and Wheezing. Breast Not Present- Breast Mass, Breast Pain, Nipple Discharge and Skin Changes. Cardiovascular Not Present- Chest Pain, Difficulty Breathing Lying Down, Leg Cramps, Palpitations, Rapid Heart Rate, Shortness of Breath and Swelling of Extremities. Gastrointestinal Not Present- Abdominal Pain, Bloating, Bloody Stool, Change in Bowel Habits, Chronic diarrhea, Constipation, Difficulty Swallowing, Excessive gas, Gets full quickly at meals, Hemorrhoids, Indigestion, Nausea, Rectal Pain and Vomiting. Female Genitourinary Not Present- Frequency, Nocturia, Painful Urination, Pelvic Pain and Urgency. Musculoskeletal Not Present- Back Pain, Joint Pain, Joint Stiffness, Muscle Pain, Muscle Weakness and Swelling of Extremities. Neurological Not Present- Decreased Memory, Fainting, Headaches, Numbness, Seizures, Tingling, Tremor, Trouble walking and Weakness. Psychiatric Not Present- Anxiety, Bipolar, Change in Sleep Pattern, Depression, Fearful and Frequent  crying. Endocrine Not Present- Cold Intolerance, Excessive Hunger, Hair Changes, Heat Intolerance, Hot flashes and New Diabetes. Hematology Not Present- Blood Thinners, Easy Bruising, Excessive bleeding, Gland problems, HIV and Persistent Infections.  Vitals Marisa Pearson(Marisa Pearson CMA; 09/29/2018 10:01 AM) 09/29/2018 10:01 AM  Weight: 159.4 lb Height: 66in Body Surface Area: 1.82 m Body Mass Index: 25.73 kg/m  Temp.: 97.21F  Pulse: 80 (Regular)  BP: 112/76 (Sitting, Left Arm, Standard)        Physical Exam Marisa Key K. Josearmando Kuhnert Marisa Pearson; 09/29/2018 11:30 AM)  The physical exam findings are as follows: Note:WDWN in NAD Eyes: Pupils equal, round; sclera anicteric HENT: Oral mucosa moist; good dentition Neck: No masses palpated, no thyromegaly Lungs: CTA bilaterally; normal respiratory effort Breasts are symmetric with no palpable masses on either side. No nipple retraction or discharge. The patient has protruding accessory breast tissue both axilla. The right sinus is greater than the left. No drainage from either side. Both areas of accessory breast tissue are mildly tender to palpation. CV: Regular rate and rhythm; no murmurs; extremities well-perfused with no edema Abd: +bowel sounds, soft, non-tender, no palpable organomegaly; no palpable hernias Skin: Warm, dry; no sign of jaundice Psychiatric - alert and oriented x 4; calm mood and affect    Assessment & Plan Marisa Key K. 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 The surgical procedure has been discussed with the patient. Potential risks, benefits, alternative treatments, and expected outcomes have been explained. All of the patient's questions at this time have been answered. The likelihood of reaching the patient's treatment goal is good. The patient  understand the proposed surgical procedure and wishes to proceed.  I explained to the patient that she will need a drain in each side to prevent seroma formation.  Marisa Burn. Georgette Dover, Marisa Pearson, Marisa Pearson Surgery  General/ Trauma Surgery Beeper [phone]  09/29/2018 11:30 AM

## 2018-10-30 ENCOUNTER — Ambulatory Visit: Payer: Self-pay | Admitting: Surgery

## 2018-10-30 LAB — OB RESULTS CONSOLE HIV ANTIBODY (ROUTINE TESTING): HIV: NONREACTIVE

## 2018-10-30 LAB — OB RESULTS CONSOLE ABO/RH: RH Type: POSITIVE

## 2018-10-30 LAB — OB RESULTS CONSOLE GC/CHLAMYDIA
Chlamydia: NEGATIVE
Gonorrhea: NEGATIVE

## 2018-10-30 LAB — OB RESULTS CONSOLE RUBELLA ANTIBODY, IGM: Rubella: IMMUNE

## 2018-10-30 LAB — OB RESULTS CONSOLE ANTIBODY SCREEN: Antibody Screen: NEGATIVE

## 2018-10-30 LAB — OB RESULTS CONSOLE HEPATITIS B SURFACE ANTIGEN: Hepatitis B Surface Ag: NEGATIVE

## 2018-10-30 LAB — OB RESULTS CONSOLE RPR: RPR: NONREACTIVE

## 2018-11-20 ENCOUNTER — Encounter (HOSPITAL_BASED_OUTPATIENT_CLINIC_OR_DEPARTMENT_OTHER): Payer: Self-pay | Admitting: *Deleted

## 2018-11-24 ENCOUNTER — Other Ambulatory Visit (HOSPITAL_COMMUNITY)
Admission: RE | Admit: 2018-11-24 | Discharge: 2018-11-24 | Disposition: A | Discharge status: Home / Self Care | Payer: 59 | Source: Ambulatory Visit | Attending: Surgery | Admitting: Surgery

## 2018-11-24 DIAGNOSIS — Z01812 Encounter for preprocedural laboratory examination: Secondary | ICD-10-CM | POA: Insufficient documentation

## 2018-11-24 DIAGNOSIS — Z20828 Contact with and (suspected) exposure to other viral communicable diseases: Secondary | ICD-10-CM | POA: Insufficient documentation

## 2018-11-24 LAB — SARS CORONAVIRUS 2 (TAT 6-24 HRS): SARS Coronavirus 2: NEGATIVE

## 2018-11-27 ENCOUNTER — Encounter (HOSPITAL_COMMUNITY): Payer: Self-pay | Admitting: *Deleted

## 2018-11-27 ENCOUNTER — Other Ambulatory Visit: Payer: Self-pay

## 2018-11-27 NOTE — Progress Notes (Signed)
Healthy, pregnant (4 months gestation) 22 year old patient denies shortness of breath, fever, cough and chest pain.  PCP - Dr Pamala Hurry  Cardiologist - Denies  Chest x-ray -Denies  EKG - Denies Stress Test - Denies ECHO - Denies Cardiac Cath - Denies  Anesthesia review: No  STOP now taking any Aspirin (unless otherwise instructed by your surgeon), Aleve, Naproxen, Ibuprofen, Motrin, Advil, Goody's, BC's, all herbal medications, fish oil, and all vitamins.   Coronavirus Screening Have you or your husband experienced the following symptoms:  Cough yes/no: No Fever (>100.10F)  yes/no: No Runny nose yes/no: No Sore throat yes/no: No Difficulty breathing/shortness of breath  yes/no: No  Have you or your husband traveled in the last 14 days and where? yes/no: No

## 2018-11-27 NOTE — Anesthesia Preprocedure Evaluation (Addendum)
Anesthesia Evaluation  Patient identified by MRN, date of birth, ID band Patient awake    Reviewed: Allergy & Precautions, NPO status , Patient's Chart, lab work & pertinent test results  Airway Mallampati: II  TM Distance: >3 FB Neck ROM: Full    Dental  (+) Teeth Intact, Dental Advisory Given   Pulmonary neg pulmonary ROS,    breath sounds clear to auscultation       Cardiovascular negative cardio ROS   Rhythm:Regular Rate:Normal     Neuro/Psych negative neurological ROS  negative psych ROS   GI/Hepatic negative GI ROS, Neg liver ROS,   Endo/Other  negative endocrine ROS  Renal/GU negative Renal ROS     Musculoskeletal negative musculoskeletal ROS (+)   Abdominal Normal abdominal exam  (+)   Peds  Hematology negative hematology ROS (+)   Anesthesia Other Findings   Reproductive/Obstetrics (+) Pregnancy                            Anesthesia Physical Anesthesia Plan  ASA: II  Anesthesia Plan: General   Post-op Pain Management:    Induction: Intravenous  PONV Risk Score and Plan: 3 and Ondansetron, TIVA and Treatment may vary due to age or medical condition  Airway Management Planned: LMA  Additional Equipment: None  Intra-op Plan:   Post-operative Plan: Extubation in OR  Informed Consent: I have reviewed the patients History and Physical, chart, labs and discussed the procedure including the risks, benefits and alternatives for the proposed anesthesia with the patient or authorized representative who has indicated his/her understanding and acceptance.     Dental advisory given  Plan Discussed with: CRNA  Anesthesia Plan Comments: (COVID-19 Labs  No results for input(s): DDIMER, FERRITIN, LDH, CRP in the last 72 hours.  Lab Results      Component                Value               Date                      [id number]              NEGATIVE            11/24/2018             )       Anesthesia Quick Evaluation

## 2018-11-28 ENCOUNTER — Encounter (HOSPITAL_COMMUNITY): Payer: Self-pay | Admitting: Surgery

## 2018-11-28 ENCOUNTER — Encounter (HOSPITAL_COMMUNITY)
Admission: RE | Disposition: A | Discharge status: Home / Self Care | Payer: Self-pay | Source: Home / Self Care | Attending: Surgery

## 2018-11-28 ENCOUNTER — Ambulatory Visit (HOSPITAL_COMMUNITY): Payer: 59 | Admitting: Certified Registered Nurse Anesthetist

## 2018-11-28 ENCOUNTER — Ambulatory Visit (HOSPITAL_COMMUNITY)
Admission: RE | Admit: 2018-11-28 | Discharge: 2018-11-28 | Disposition: A | Discharge status: Home / Self Care | Payer: 59 | Attending: Surgery | Admitting: Surgery

## 2018-11-28 DIAGNOSIS — Q831 Accessory breast: Secondary | ICD-10-CM | POA: Diagnosis not present

## 2018-11-28 HISTORY — PX: EXCISION OF BREAST BIOPSY: SHX5822

## 2018-11-28 LAB — HEMOGLOBIN: Hemoglobin: 12.7 g/dL (ref 12.0–15.0)

## 2018-11-28 SURGERY — EXCISION OF BREAST BIOPSY
Anesthesia: General | Site: Axilla | Laterality: Bilateral

## 2018-11-28 MED ORDER — FENTANYL CITRATE (PF) 250 MCG/5ML IJ SOLN
INTRAMUSCULAR | Status: AC
  Filled 2018-11-28: qty 5

## 2018-11-28 MED ORDER — OXYCODONE HCL 5 MG PO TABS
5.0000 mg | ORAL_TABLET | Freq: Four times a day (QID) | ORAL | Status: DC | PRN
  Administered 2018-11-28: 5 mg via ORAL

## 2018-11-28 MED ORDER — BUPIVACAINE-EPINEPHRINE (PF) 0.25% -1:200000 IJ SOLN
INTRAMUSCULAR | Status: AC
  Filled 2018-11-28: qty 30

## 2018-11-28 MED ORDER — PROPOFOL 500 MG/50ML IV EMUL
INTRAVENOUS | Status: DC | PRN
  Administered 2018-11-28: 150 ug/kg/min via INTRAVENOUS

## 2018-11-28 MED ORDER — PROPOFOL 10 MG/ML IV BOLUS
INTRAVENOUS | Status: AC
  Filled 2018-11-28: qty 20

## 2018-11-28 MED ORDER — ACETAMINOPHEN 10 MG/ML IV SOLN: 1000.0000 mg | Freq: Once | INTRAVENOUS | Status: DC | PRN

## 2018-11-28 MED ORDER — CHLORHEXIDINE GLUCONATE CLOTH 2 % EX PADS: 6.0000 | MEDICATED_PAD | Freq: Once | CUTANEOUS | Status: DC

## 2018-11-28 MED ORDER — LIDOCAINE 2% (20 MG/ML) 5 ML SYRINGE
INTRAMUSCULAR | Status: DC | PRN
  Administered 2018-11-28: 80 mg via INTRAVENOUS

## 2018-11-28 MED ORDER — OXYCODONE HCL 5 MG PO TABS
ORAL_TABLET | ORAL | Status: AC
  Filled 2018-11-28: qty 1

## 2018-11-28 MED ORDER — PROPOFOL 10 MG/ML IV BOLUS
INTRAVENOUS | Status: DC | PRN
  Administered 2018-11-28: 200 mg via INTRAVENOUS
  Administered 2018-11-28: 50 mg via INTRAVENOUS
  Administered 2018-11-28: 30 mg via INTRAVENOUS

## 2018-11-28 MED ORDER — PROMETHAZINE HCL 25 MG/ML IJ SOLN
INTRAMUSCULAR | Status: AC
  Filled 2018-11-28: qty 1

## 2018-11-28 MED ORDER — CEFAZOLIN SODIUM-DEXTROSE 2-4 GM/100ML-% IV SOLN
2.0000 g | INTRAVENOUS | Status: AC
  Administered 2018-11-28: 09:00:00 2 g via INTRAVENOUS
  Filled 2018-11-28: qty 100

## 2018-11-28 MED ORDER — MEPERIDINE HCL 25 MG/ML IJ SOLN: 6.2500 mg | INTRAMUSCULAR | Status: DC | PRN

## 2018-11-28 MED ORDER — PHENYLEPHRINE 40 MCG/ML (10ML) SYRINGE FOR IV PUSH (FOR BLOOD PRESSURE SUPPORT)
PREFILLED_SYRINGE | INTRAVENOUS | Status: AC
  Filled 2018-11-28: qty 20

## 2018-11-28 MED ORDER — GABAPENTIN 300 MG PO CAPS
300.0000 mg | ORAL_CAPSULE | ORAL | Status: DC
  Filled 2018-11-28: qty 1

## 2018-11-28 MED ORDER — ONDANSETRON HCL 4 MG/2ML IJ SOLN
INTRAMUSCULAR | Status: AC
  Filled 2018-11-28: qty 2

## 2018-11-28 MED ORDER — CELECOXIB 200 MG PO CAPS
200.0000 mg | ORAL_CAPSULE | ORAL | Status: DC
  Filled 2018-11-28: qty 1

## 2018-11-28 MED ORDER — ACETAMINOPHEN 160 MG/5ML PO SOLN: 325.0000 mg | Freq: Once | ORAL | Status: DC | PRN

## 2018-11-28 MED ORDER — PROPOFOL 500 MG/50ML IV EMUL
INTRAVENOUS | Status: AC
  Filled 2018-11-28: qty 100

## 2018-11-28 MED ORDER — ACETAMINOPHEN 500 MG PO TABS
1000.0000 mg | ORAL_TABLET | ORAL | Status: AC
  Administered 2018-11-28: 08:00:00 1000 mg via ORAL
  Filled 2018-11-28: qty 2

## 2018-11-28 MED ORDER — 0.9 % SODIUM CHLORIDE (POUR BTL) OPTIME
TOPICAL | Status: DC | PRN
  Administered 2018-11-28: 1000 mL

## 2018-11-28 MED ORDER — OXYCODONE HCL 5 MG PO TABS: 5.0000 mg | ORAL_TABLET | Freq: Four times a day (QID) | ORAL | 0 refills | Status: DC | PRN

## 2018-11-28 MED ORDER — PROPOFOL 1000 MG/100ML IV EMUL
INTRAVENOUS | Status: AC
  Filled 2018-11-28: qty 200

## 2018-11-28 MED ORDER — LIDOCAINE 2% (20 MG/ML) 5 ML SYRINGE
INTRAMUSCULAR | Status: AC
  Filled 2018-11-28: qty 5

## 2018-11-28 MED ORDER — DEXAMETHASONE SODIUM PHOSPHATE 10 MG/ML IJ SOLN
INTRAMUSCULAR | Status: AC
  Filled 2018-11-28: qty 1

## 2018-11-28 MED ORDER — FENTANYL CITRATE (PF) 100 MCG/2ML IJ SOLN: 25.0000 ug | INTRAMUSCULAR | Status: DC | PRN

## 2018-11-28 MED ORDER — ACETAMINOPHEN 325 MG PO TABS: 325.0000 mg | ORAL_TABLET | Freq: Once | ORAL | Status: DC | PRN

## 2018-11-28 MED ORDER — LACTATED RINGERS IV SOLN
INTRAVENOUS | Status: DC | PRN
  Administered 2018-11-28: 09:00:00 via INTRAVENOUS

## 2018-11-28 MED ORDER — BUPIVACAINE-EPINEPHRINE 0.25% -1:200000 IJ SOLN
INTRAMUSCULAR | Status: DC | PRN
  Administered 2018-11-28 (×2): 10 mL

## 2018-11-28 MED ORDER — PHENYLEPHRINE 40 MCG/ML (10ML) SYRINGE FOR IV PUSH (FOR BLOOD PRESSURE SUPPORT)
PREFILLED_SYRINGE | INTRAVENOUS | Status: DC | PRN
  Administered 2018-11-28: 80 ug via INTRAVENOUS
  Administered 2018-11-28: 120 ug via INTRAVENOUS
  Administered 2018-11-28: 40 ug via INTRAVENOUS
  Administered 2018-11-28 (×3): 80 ug via INTRAVENOUS

## 2018-11-28 MED ORDER — LACTATED RINGERS IV SOLN: INTRAVENOUS | Status: DC

## 2018-11-28 MED ORDER — FENTANYL CITRATE (PF) 100 MCG/2ML IJ SOLN
INTRAMUSCULAR | Status: DC | PRN
  Administered 2018-11-28 (×7): 25 ug via INTRAVENOUS

## 2018-11-28 MED ORDER — PROMETHAZINE HCL 25 MG/ML IJ SOLN
6.2500 mg | INTRAMUSCULAR | Status: DC | PRN
  Administered 2018-11-28: 13:00:00 6.25 mg via INTRAVENOUS

## 2018-11-28 SURGICAL SUPPLY — 45 items
BENZOIN TINCTURE PRP APPL 2/3 (GAUZE/BANDAGES/DRESSINGS) ×6 IMPLANT
BINDER BREAST LRG (GAUZE/BANDAGES/DRESSINGS) ×3 IMPLANT
CHLORAPREP W/TINT 26 (MISCELLANEOUS) ×6 IMPLANT
CLOSURE WOUND 1/2 X4 (GAUZE/BANDAGES/DRESSINGS) ×2
CONT SPEC 4OZ CLIKSEAL STRL BL (MISCELLANEOUS) ×6 IMPLANT
COVER SURGICAL LIGHT HANDLE (MISCELLANEOUS) ×3 IMPLANT
COVER WAND RF STERILE (DRAPES) ×3 IMPLANT
DECANTER SPIKE VIAL GLASS SM (MISCELLANEOUS) ×3 IMPLANT
DRAIN CHANNEL 19F RND (DRAIN) ×6 IMPLANT
DRAPE CHEST BREAST 15X10 FENES (DRAPES) ×3 IMPLANT
DRSG TEGADERM 4X4.75 (GAUZE/BANDAGES/DRESSINGS) ×6 IMPLANT
ELECT BLADE 4.0 EZ CLEAN MEGAD (MISCELLANEOUS) ×3
ELECT REM PT RETURN 9FT ADLT (ELECTROSURGICAL) ×3
ELECTRODE BLDE 4.0 EZ CLN MEGD (MISCELLANEOUS) ×1 IMPLANT
ELECTRODE REM PT RTRN 9FT ADLT (ELECTROSURGICAL) ×1 IMPLANT
EVACUATOR SILICONE 100CC (DRAIN) ×6 IMPLANT
GAUZE SPONGE 4X4 12PLY STRL (GAUZE/BANDAGES/DRESSINGS) ×3 IMPLANT
GLOVE BIO SURGEON STRL SZ7 (GLOVE) ×6 IMPLANT
GLOVE BIOGEL PI IND STRL 7.0 (GLOVE) ×1 IMPLANT
GLOVE BIOGEL PI IND STRL 7.5 (GLOVE) ×1 IMPLANT
GLOVE BIOGEL PI INDICATOR 7.0 (GLOVE) ×2
GLOVE BIOGEL PI INDICATOR 7.5 (GLOVE) ×2
GLOVE ECLIPSE 7.0 STRL STRAW (GLOVE) IMPLANT
GOWN STRL REUS W/ TWL LRG LVL3 (GOWN DISPOSABLE) ×2 IMPLANT
GOWN STRL REUS W/TWL LRG LVL3 (GOWN DISPOSABLE) ×4
GOWN STRL REUS W/TWL XL LVL3 (GOWN DISPOSABLE) IMPLANT
KIT BASIN OR (CUSTOM PROCEDURE TRAY) ×3 IMPLANT
KIT TURNOVER KIT B (KITS) ×3 IMPLANT
LIGHT WAVEGUIDE WIDE FLAT (MISCELLANEOUS) ×3 IMPLANT
NEEDLE HYPO 25GX1X1/2 BEV (NEEDLE) ×3 IMPLANT
NS IRRIG 1000ML POUR BTL (IV SOLUTION) ×3 IMPLANT
PACK GENERAL/GYN (CUSTOM PROCEDURE TRAY) ×3 IMPLANT
PAD ARMBOARD 7.5X6 YLW CONV (MISCELLANEOUS) ×3 IMPLANT
PENCIL SMOKE EVACUATOR (MISCELLANEOUS) ×3 IMPLANT
SPONGE LAP 4X18 RFD (DISPOSABLE) ×3 IMPLANT
STRIP CLOSURE SKIN 1/2X4 (GAUZE/BANDAGES/DRESSINGS) ×4 IMPLANT
SUT ETHILON 2 0 FS 18 (SUTURE) ×6 IMPLANT
SUT MNCRL AB 4-0 PS2 18 (SUTURE) ×6 IMPLANT
SUT SILK 2 0 SH (SUTURE) ×6 IMPLANT
SUT VIC AB 3-0 SH 27 (SUTURE) ×4
SUT VIC AB 3-0 SH 27XBRD (SUTURE) ×2 IMPLANT
SUT VIC AB 3-0 SH 8-18 (SUTURE) ×6 IMPLANT
SYR CONTROL 10ML LL (SYRINGE) ×3 IMPLANT
TOWEL GREEN STERILE (TOWEL DISPOSABLE) ×3 IMPLANT
TOWEL GREEN STERILE FF (TOWEL DISPOSABLE) ×3 IMPLANT

## 2018-11-28 NOTE — Anesthesia Procedure Notes (Signed)
Procedure Name: LMA Insertion °Performed by: Jaylenn Baiza H, CRNA °Pre-anesthesia Checklist: Patient identified, Emergency Drugs available, Suction available and Patient being monitored °Patient Re-evaluated:Patient Re-evaluated prior to induction °Oxygen Delivery Method: Circle System Utilized °Preoxygenation: Pre-oxygenation with 100% oxygen °Induction Type: IV induction °Ventilation: Mask ventilation without difficulty °LMA: LMA inserted °LMA Size: 4.0 °Number of attempts: 1 °Airway Equipment and Method: Bite block °Placement Confirmation: positive ETCO2 °Tube secured with: Tape °Dental Injury: Teeth and Oropharynx as per pre-operative assessment  ° ° ° ° ° ° °

## 2018-11-28 NOTE — Anesthesia Postprocedure Evaluation (Signed)
Anesthesia Post Note  Patient: Marisa Pearson  Procedure(s) Performed: EXCISION OF BILATERAL AXILLARY ACCESSORY BREAST TISSUE (Bilateral Axilla)     Patient location during evaluation: PACU Anesthesia Type: General Level of consciousness: awake and alert Pain management: pain level controlled Vital Signs Assessment: post-procedure vital signs reviewed and stable Respiratory status: spontaneous breathing, nonlabored ventilation, respiratory function stable and patient connected to nasal cannula oxygen Cardiovascular status: blood pressure returned to baseline and stable Postop Assessment: no apparent nausea or vomiting Anesthetic complications: no    Last Vitals:  Vitals:   11/28/18 1245 11/28/18 1300  BP: (!) 127/96 (!) 148/89  Pulse: 100 85  Resp: 18 14  Temp:    SpO2: 100% 100%    Last Pain:  Vitals:   11/28/18 1300  TempSrc:   PainSc: Asleep                 Effie Berkshire

## 2018-11-28 NOTE — Transfer of Care (Signed)
Immediate Anesthesia Transfer of Care Note  Patient: Marisa Pearson  Procedure(s) Performed: EXCISION OF BILATERAL AXILLARY ACCESSORY BREAST TISSUE (Bilateral Axilla)  Patient Location: PACU  Anesthesia Type:General  Level of Consciousness: drowsy  Airway & Oxygen Therapy: Patient Spontanous Breathing and Patient connected to nasal cannula oxygen  Post-op Assessment: Report given to RN and Post -op Vital signs reviewed and stable  Post vital signs: Reviewed and stable  Last Vitals:  Vitals Value Taken Time  BP 107/60 11/28/18 1048  Temp    Pulse 84 11/28/18 1052  Resp 18 11/28/18 1052  SpO2 100 % 11/28/18 1052  Vitals shown include unvalidated device data.  Last Pain:  Vitals:   11/28/18 0725  TempSrc:   PainSc: 0-No pain      Patients Stated Pain Goal: 1 (25/85/27 7824)  Complications: No apparent anesthesia complications

## 2018-11-28 NOTE — Discharge Instructions (Signed)
[facility] [phone]   POST OP INSTRUCTIONS  Always review your discharge instruction sheet given to you by the facility where your surgery was performed. IF YOU HAVE DISABILITY OR FAMILY LEAVE FORMS, YOU MUST BRING THEM TO THE OFFICE FOR PROCESSING.   DO NOT GIVE THEM TO YOUR DOCTOR. A prescription for pain medication may be given to you upon discharge.  Take your pain medication as prescribed, if needed.  If narcotic pain medicine is not needed, then you may take acetaminophen (Tylenol) or ibuprofen (Advil) as needed. 1. Take your usually prescribed medications unless otherwise directed. 2. If you need a refill on your pain medication, please contact your pharmacy.  They will contact our office to request authorization.  Prescriptions will not be filled after 5pm or on week-ends. 3. You should follow a light diet the first few days after arrival home, such as soup and crackers, etc.  Resume your normal diet the day after surgery. 4. Most patients will experience some swelling and bruising in both underarms.  Ice packs will help.  Swelling and bruising can take several days to resolve.  5. It is common to experience some constipation if taking pain medication after surgery.  Increasing fluid intake and taking a stool softener (such as Colace) will usually help or prevent this problem from occurring.  A mild laxative (Milk of Magnesia or Miralax) should be taken according to package instructions if there are no bowel movements after 48 hours. 6. Unless discharge instructions indicate otherwise, leave your bandage dry and in place until your next appointment in 3-5 days.  You may take a limited sponge bath.  No tube baths or showers until the drains are removed.   7. DRAINS:  If you have drains in place, it is important to keep a list of the amount of drainage produced each day in your drains.  Before leaving the hospital, you should be instructed on drain care.  Call our  office if you have any questions about your drains. 8. ACTIVITIES:  You may resume regular (light) daily activities beginning the next day--such as daily self-care, walking, climbing stairs--gradually increasing activities as tolerated.  You may have sexual intercourse when it is comfortable.  Refrain from any heavy lifting or straining until approved by your doctor. a. You may drive when you are no longer taking prescription pain medication, you can comfortably wear a seatbelt, and you can safely maneuver your car and apply brakes. b. RETURN TO WORK:  __________________________________________________________ 9. You should see your doctor in the office for a follow-up appointment approximately 3-5 days after your surgery.  Your doctors nurse will typically make your follow-up appointment when she calls you with your pathology report.  Expect your pathology report 2-3 business days after your surgery.  You may call to check if you do not hear from [place] after three days.   10. OTHER INSTRUCTIONS: ______________________________________________________________________________________________ ____________________________________________________________________________________________ WHEN TO CALL YOUR DOCTOR: 1. Fever over 101.0 2. Nausea and/or vomiting 3. Extreme swelling or bruising 4. Continued bleeding from incision. 5. Increased pain, redness, or drainage from the incision. The clinic staff is available to answer your questions during regular business hours.  Please dont hesitate to call and ask to speak to one of the nurses for clinical concerns.  If you have a medical emergency, go to the nearest emergency room or call 911.  A surgeon from [facility] is always on call at the hospital. [address] [place], [place],  [place]  [id number] ? [address] [address], [place], [place]   [id number] [phone] ? [phone] ? FAX [phone] Web site: [email or web address]

## 2018-11-28 NOTE — H&P (Signed)
History of Present Illness The patient is a 23 year old female who presents with a complaint of Breast pain. Referred by Dr. Viviann SpareKelly Pearson for painful bilateral axillary accessory breast tissue  This is a healthy 23 year old female who presents with swelling and tenderness in both axilla. The patient had her first pregnancy at age 23. During her pregnancy she developed swelling of some accessory breast tissue in each axilla. Both of these areas became very tender. After her pregnancy they swelling and tenderness persisted. Both areas remain tender to palpation although the size of the swelling has gone down. The patient is no longer breast-feeding. She is on oral contraceptives. There has been no improvement since starting oral contraceptives. She has not had any imaging of this area. She has not had any drainage from these areas. She presents now to discuss excision of the bilateral axillary accessory breast tissue.  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 Medications Reconciled  Social History  Caffeine use Coffee, Tea. No alcohol use No drug use Tobacco use Never smoker.  Family History  First Degree Relatives No pertinent family history  Pregnancy / Birth History  Age at menarche 12 years. Contraceptive History Oral contraceptives. Gravida 1 Irregular periods Length (months) of breastfeeding 7-12 Maternal age 14-25  Other Problems  No pertinent past medical history     Review of Systems   General Not Present- Appetite Loss, Chills, Fatigue, Fever, Night Sweats, Weight Gain and Weight Loss. Skin Not Present- Change in Wart/Mole, Dryness, Hives, Jaundice, New Lesions, Non-Healing Wounds, Rash and Ulcer. HEENT Not Present- Earache, Hearing Loss, Hoarseness, Nose Bleed, Oral Ulcers, Ringing in the Ears, Seasonal Allergies, Sinus Pain, Sore Throat, Visual Disturbances, Wears glasses/contact lenses and Yellow Eyes. Respiratory Not Present- Bloody sputum, Chronic Cough, Difficulty Breathing, Snoring and Wheezing. Breast Not Present- Breast Mass, Breast Pain, Nipple Discharge and Skin Changes. Cardiovascular Not Present- Chest Pain, Difficulty Breathing Lying Down, Leg Cramps, Palpitations, Rapid Heart Rate, Shortness of Breath and Swelling of Extremities. Gastrointestinal Not Present- Abdominal Pain, Bloating, Bloody Stool, Change in Bowel Habits, Chronic diarrhea, Constipation, Difficulty Swallowing, Excessive gas, Gets full quickly at meals, Hemorrhoids, Indigestion, Nausea, Rectal Pain and Vomiting. Female Genitourinary Not Present- Frequency, Nocturia, Painful Urination, Pelvic Pain and Urgency. Musculoskeletal Not Present- Back Pain, Joint Pain, Joint Stiffness, Muscle Pain, Muscle Weakness and Swelling of Extremities. Neurological Not Present- Decreased Memory, Fainting, Headaches, Numbness, Seizures, Tingling, Tremor, Trouble walking and Weakness. Psychiatric Not Present- Anxiety, Bipolar, Change in Sleep Pattern, Depression, Fearful and Frequent crying. Endocrine Not Present- Cold Intolerance, Excessive Hunger, Hair Changes, Heat Intolerance, Hot flashes and New Diabetes. Hematology Not Present- Blood Thinners, Easy Bruising, Excessive bleeding, Gland problems, HIV and Persistent Infections.  Vitals Weight: 159.4 lb Height: 66in Body Surface Area: 1.82 m Body Mass Index: 25.73 kg/m  Temp.: 97.3F  Pulse: 80 (Regular)  BP: 112/76 (Sitting, Left Arm,  Standard)        Physical Exam   The physical exam findings are as follows: Note:WDWN in NAD Eyes: Pupils equal, round; sclera anicteric HENT: Oral mucosa moist; good dentition Neck: No masses palpated, no thyromegaly Lungs:  CTA bilaterally; normal respiratory effort Breasts are symmetric with no palpable masses on either side. No nipple retraction or discharge. The patient has protruding accessory breast tissue both axilla. The right side is greater than the left. No drainage from either side. Both areas of accessory breast tissue are mildly tender to palpation. 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 The surgical procedure has been discussed with the patient. Potential risks, benefits, alternative treatments, and expected outcomes have been explained. All of the patient's questions at this time have been answered. The likelihood of reaching the patient's treatment goal is good. The patient understand the proposed surgical procedure and wishes to proceed.  I explained to the patient that she will need a drain in each side to prevent seroma formation.  Since the patient was seen in the office on 09/29/18, she became pregnant.  After discussion with Dr. Valentino Pearson, we will proceed with surgery during the second trimester.  Both axillae became engorged and painful with her last child and she was unable to continue breast feeding.  Hopefully, by removing the accessory breast tissue, she will avoid those problems and be able to breast feed with this  pregnancy.  Marisa Pearson. Marisa Dover, MD, [facility]  General/ Trauma Surgery Beeper [phone]  11/28/2018 8:46 AM

## 2018-11-28 NOTE — Op Note (Signed)
Preop diagnosis: Tender accessory breast tissue bilateral axilla  Postop diagnosis: Same Procedure performed: Excision of accessory breast tissue bilateral axilla Surgeon:Shavawn Stobaugh K Merrit Waugh Anesthesia: General via LMA  Indications:  This is a healthy 23 year old female who presents with swelling and tenderness in both axilla. The patient had her first pregnancy at age 46. During her pregnancy she developed swelling of some accessory breast tissue in each axilla. Both of these areas became very tender. After her pregnancy they swelling and tenderness persisted. Both areas remain tender to palpation although the size of the swelling has gone down. The patient is no longer breast-feeding. She is on oral contraceptives. There has been no improvement since starting oral contraceptives. She has not had any imaging of this area. She has not had any drainage from these areas. She presents now to discuss excision of the bilateral axillary accessory breast tissue.  Since the patient was seen in the office on 09/29/18, she became pregnant.  After discussion with Dr. Valentino Saxon, we will proceed with surgery during the second trimester.  Both axillae became engorged and painful with her last child and she was unable to continue breast feeding.  Hopefully, by removing the accessory breast tissue, she will avoid those problems and be able to breast feed with this pregnancy.  Findings: Normal-appearing accessory breast tissue in both axilla.  On the right side we excised an 7 x 5 x 4 cm area of accessory breast tissue.  On the left side, we excised an 8 x 5 x 4 cm area of breast tissue.  Description of procedure: The patient is brought to the operating room placed in the supine position on the operating room table.  After an adequate level of general anesthesia was obtained, her entire chest and both axilla were prepped with ChloraPrep and draped in sterile fashion.  A timeout was taken to ensure the proper patient  and proper procedure.  We began on the right side.  I used a skin marker to outline an elliptical incision to take most of the protruding accessory breast tissue.  We raised our skin incisions and skin flaps and superior and inferior directions.  We dissected all the accessory breast tissue off down to the underlying muscle and axillary contents.  We irrigated the wound thoroughly and inspected for hemostasis.  The wound was closed with a deep layer of 3-0 Vicryl and a subcuticular layer of 4-0 Monocryl.  Prior to closing, we placed a 19 French drain into the deep subcutaneous space.  This was secured with a 2-0 nylon.  We then turned our attention to the left axilla.  I drew a similar type elliptical incision.  We excised an area of breast tissue that was slightly larger than the right side.  Again we irrigated and placed a 19 [place] drain.  We closed with 3-0 Vicryl 4-0 Monocryl.  Benzoin Steri-Strips were applied to both sides.  Dressings were applied.  Both drains were placed to suction.  The patient was then extubated and brought to the recovery room in stable condition.  All sponge, instrument, and needle counts are correct.  Imogene Burn. Georgette Dover, MD, [facility]  General/ Trauma Surgery Beeper [phone]  11/28/2018 10:45 AM

## 2018-11-29 ENCOUNTER — Encounter (HOSPITAL_COMMUNITY): Payer: Self-pay | Admitting: Surgery

## 2019-04-06 NOTE — L&D Delivery Note (Signed)
Delivery Note At 12:39 AM a viable female was delivered via Vaginal, Spontaneous (Presentation:  DOA    ).  APGAR: per nursing, good cry/ tone , ; weight  pending.   Placenta status: Spontaneous, Intact.  Cord: 3 vessels with the following complications: None.  Cord pH: nS  Anesthesia: Epidural- not working well Episiotomy: None Lacerations: None Suture Repair: n/a Est. Blood Loss (mL): 250  Mom to postpartum.  Baby to Couplet care / Skin to Skin.  Lendon Colonel 05/19/2019, 12:48 AM

## 2019-04-11 DIAGNOSIS — Z3A36 36 weeks gestation of pregnancy: Secondary | ICD-10-CM | POA: Diagnosis not present

## 2019-04-11 DIAGNOSIS — O3663X0 Maternal care for excessive fetal growth, third trimester, not applicable or unspecified: Secondary | ICD-10-CM | POA: Diagnosis not present

## 2019-04-11 DIAGNOSIS — Z3A34 34 weeks gestation of pregnancy: Secondary | ICD-10-CM | POA: Diagnosis not present

## 2019-04-11 DIAGNOSIS — O328XX0 Maternal care for other malpresentation of fetus, not applicable or unspecified: Secondary | ICD-10-CM | POA: Diagnosis not present

## 2019-04-11 DIAGNOSIS — Z3480 Encounter for supervision of other normal pregnancy, unspecified trimester: Secondary | ICD-10-CM | POA: Diagnosis not present

## 2019-04-11 DIAGNOSIS — Z3685 Encounter for antenatal screening for Streptococcus B: Secondary | ICD-10-CM | POA: Diagnosis not present

## 2019-04-26 DIAGNOSIS — Z3685 Encounter for antenatal screening for Streptococcus B: Secondary | ICD-10-CM | POA: Diagnosis not present

## 2019-04-26 DIAGNOSIS — Z3A36 36 weeks gestation of pregnancy: Secondary | ICD-10-CM | POA: Diagnosis not present

## 2019-04-26 DIAGNOSIS — O328XX0 Maternal care for other malpresentation of fetus, not applicable or unspecified: Secondary | ICD-10-CM | POA: Diagnosis not present

## 2019-04-26 LAB — OB RESULTS CONSOLE GBS: GBS: NEGATIVE

## 2019-05-04 DIAGNOSIS — Z3A37 37 weeks gestation of pregnancy: Secondary | ICD-10-CM | POA: Diagnosis not present

## 2019-05-04 DIAGNOSIS — O328XX0 Maternal care for other malpresentation of fetus, not applicable or unspecified: Secondary | ICD-10-CM | POA: Diagnosis not present

## 2019-05-10 DIAGNOSIS — Z3A38 38 weeks gestation of pregnancy: Secondary | ICD-10-CM | POA: Diagnosis not present

## 2019-05-10 DIAGNOSIS — O328XX0 Maternal care for other malpresentation of fetus, not applicable or unspecified: Secondary | ICD-10-CM | POA: Diagnosis not present

## 2019-05-11 ENCOUNTER — Telehealth (HOSPITAL_COMMUNITY): Payer: Self-pay | Admitting: *Deleted

## 2019-05-11 ENCOUNTER — Encounter (HOSPITAL_COMMUNITY): Payer: Self-pay | Admitting: *Deleted

## 2019-05-11 NOTE — Telephone Encounter (Signed)
Preadmission screen  

## 2019-05-14 ENCOUNTER — Encounter (HOSPITAL_COMMUNITY): Payer: Self-pay | Admitting: *Deleted

## 2019-05-14 ENCOUNTER — Other Ambulatory Visit: Payer: Self-pay | Admitting: Obstetrics

## 2019-05-14 NOTE — Telephone Encounter (Signed)
Preadmission screen Interpreter number [phone]

## 2019-05-16 ENCOUNTER — Other Ambulatory Visit (HOSPITAL_COMMUNITY)
Admission: RE | Admit: 2019-05-16 | Discharge: 2019-05-16 | Disposition: A | Discharge status: Home / Self Care | Payer: BC Managed Care – PPO | Source: Ambulatory Visit | Attending: Obstetrics & Gynecology | Admitting: Obstetrics & Gynecology

## 2019-05-16 DIAGNOSIS — Z20822 Contact with and (suspected) exposure to covid-19: Secondary | ICD-10-CM | POA: Insufficient documentation

## 2019-05-16 DIAGNOSIS — Z01812 Encounter for preprocedural laboratory examination: Secondary | ICD-10-CM | POA: Diagnosis not present

## 2019-05-16 LAB — SARS CORONAVIRUS 2 (TAT 6-24 HRS): SARS Coronavirus 2: NEGATIVE

## 2019-05-17 DIAGNOSIS — O365930 Maternal care for other known or suspected poor fetal growth, third trimester, not applicable or unspecified: Secondary | ICD-10-CM | POA: Diagnosis not present

## 2019-05-17 DIAGNOSIS — Z3A39 39 weeks gestation of pregnancy: Secondary | ICD-10-CM | POA: Diagnosis not present

## 2019-05-17 NOTE — H&P (Signed)
Marisa Pearson is a 24 y.o. G2P1001 at [redacted]w[redacted]d presenting for IOL due to term gestation and iUGR. Pt notes rare contractions and overall general discomfort . Good fetal movement, No vaginal bleeding, not leaking fluid.  Admitted this am, started on pitocin, starting to feel more painful contractions at this time.   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 They are aware of risks of a child with SS anemia - IUGR. U/s done 1/21: 5'7/ 18% but AC 2%, nl UA dopplers, reactive weekly testing - variable lie, breech then funic presentation then vtx, assess presentation prior to IOL - b/l axillary dissection/ removal of accessory breast tissue done at 14 wks pregnancy. Pt with significant pain and recurrent mastitis in this tissue PP last pregnancy - GBS neg   Prenatal Transfer Tool  Maternal Diabetes: No Genetic Screening: Normal Maternal Ultrasounds/Referrals: IUGR Fetal Ultrasounds or other Referrals:  None Maternal Substance Abuse:  No Significant Maternal Medications:  None Significant Maternal Lab Results: Group B Strep negative     OB History    Gravida  2   Para  1   Term  1   Preterm      AB      Living  1     SAB      TAB      Ectopic      Multiple  0   Live Births  1          Past Medical History:  Diagnosis Date  . Medical history non-contributory   . SVD (spontaneous vaginal delivery)    x 1   Past Surgical History:  Procedure Laterality Date  . EXCISION OF BREAST BIOPSY Bilateral 11/28/2018   Procedure: EXCISION OF BILATERAL AXILLARY ACCESSORY BREAST TISSUE;  Surgeon: Manus Rudd, MD;  Location: [facility] OR;  Service: General;  Laterality: Bilateral;  . NO PAST SURGERIES     Family History: family history includes Hypertension in her maternal grandfather and maternal grandmother. Social  History:  reports that she has never smoked. She has never used smokeless tobacco. She reports that she does not drink alcohol or use drugs.  Review of Systems - Negative except discomfort of pregnancy  PE: Vitals:   05/18/19 1333 05/18/19 1415 05/18/19 1507 05/18/19 1530  BP: 107/65 (!) 107/56 (!) 100/55   Pulse: 96 93 95   Resp: 17 18 17 16   Temp:      TempSrc:      Weight:      Height:      Gen: breathing through contractions, no distress Abd: Gravid, NT LE: NT, no edema Cvx:1cm/ very posterior/ medium consistency/ uneffaced/ vtx -5, slighlty ballotable RTUS: vtx presentation, no cord Toco: q70min (pit at 81munits/ min) FH: 135s, + accels, no decels, 10 beat var      Prenatal labs: ABO, Rh: O/Positive/-- (07/27 0000) Antibody: Negative (07/27 0000) Rubella: Immune (07/27 0000) RPR: Nonreactive (07/27 0000)  HBsAg: Negative (07/27 0000)  HIV: Non-reactive (07/27 0000)  GBS: Negative/-- (01/21 0000)  1 hr Glucola 106  Genetic screening nl quad Anatomy 11-04-1998 normal   Assessment/Plan: 24 y.o. G2P1001 at [redacted]w[redacted]d - IUGR by low AC at 2%. Recc proceed with IOL prior to 40 wks. - IOL. Plan pitocin/ AROM when in active labor. Ensure vtx presentation prior to starting IOL - GBS neg - SS risk  to baby- alert peds    Ala Dach 05/18/2019 3:44 PM

## 2019-05-18 ENCOUNTER — Encounter (HOSPITAL_COMMUNITY): Payer: Self-pay | Admitting: Obstetrics

## 2019-05-18 ENCOUNTER — Inpatient Hospital Stay (HOSPITAL_COMMUNITY): Payer: BC Managed Care – PPO | Admitting: Anesthesiology

## 2019-05-18 ENCOUNTER — Inpatient Hospital Stay (HOSPITAL_COMMUNITY): Payer: BC Managed Care – PPO

## 2019-05-18 ENCOUNTER — Other Ambulatory Visit: Payer: Self-pay

## 2019-05-18 ENCOUNTER — Inpatient Hospital Stay (HOSPITAL_COMMUNITY)
Admission: AD | Admit: 2019-05-18 | Discharge: 2019-05-20 | DRG: 807 | Disposition: A | Discharge status: Home / Self Care | Payer: BC Managed Care – PPO | Attending: Obstetrics | Admitting: Obstetrics

## 2019-05-18 DIAGNOSIS — D573 Sickle-cell trait: Secondary | ICD-10-CM | POA: Diagnosis not present

## 2019-05-18 DIAGNOSIS — O365930 Maternal care for other known or suspected poor fetal growth, third trimester, not applicable or unspecified: Secondary | ICD-10-CM | POA: Diagnosis not present

## 2019-05-18 DIAGNOSIS — O9902 Anemia complicating childbirth: Secondary | ICD-10-CM | POA: Diagnosis not present

## 2019-05-18 DIAGNOSIS — Z3A39 39 weeks gestation of pregnancy: Secondary | ICD-10-CM

## 2019-05-18 DIAGNOSIS — Z3490 Encounter for supervision of normal pregnancy, unspecified, unspecified trimester: Secondary | ICD-10-CM | POA: Diagnosis present

## 2019-05-18 LAB — TYPE AND SCREEN
ABO/RH(D): O POS
Antibody Screen: NEGATIVE

## 2019-05-18 LAB — ABO/RH: ABO/RH(D): O POS

## 2019-05-18 LAB — RPR: RPR Ser Ql: NONREACTIVE

## 2019-05-18 LAB — CBC
HCT: 38.4 % (ref 36.0–46.0)
Hemoglobin: 12.5 g/dL (ref 12.0–15.0)
MCH: 25.2 pg — ABNORMAL LOW (ref 26.0–34.0)
MCHC: 32.6 g/dL (ref 30.0–36.0)
MCV: 77.4 fL — ABNORMAL LOW (ref 80.0–100.0)
Platelets: 215 10*3/uL (ref 150–400)
RBC: 4.96 MIL/uL (ref 3.87–5.11)
RDW: 14.7 % (ref 11.5–15.5)
WBC: 7.6 10*3/uL (ref 4.0–10.5)
nRBC: 0 % (ref 0.0–0.2)

## 2019-05-18 MED ORDER — FENTANYL CITRATE (PF) 100 MCG/2ML IJ SOLN
100.0000 ug | INTRAMUSCULAR | Status: DC | PRN
  Administered 2019-05-18 (×2): 100 ug via INTRAVENOUS
  Filled 2019-05-18 (×2): qty 2

## 2019-05-18 MED ORDER — OXYTOCIN 40 UNITS IN NORMAL SALINE INFUSION - SIMPLE MED
1.0000 m[IU]/min | INTRAVENOUS | Status: DC
  Administered 2019-05-18: 2 m[IU]/min via INTRAVENOUS
  Filled 2019-05-18: qty 1000

## 2019-05-18 MED ORDER — EPHEDRINE 5 MG/ML INJ: 10.0000 mg | INTRAVENOUS | Status: DC | PRN

## 2019-05-18 MED ORDER — LACTATED RINGERS IV SOLN
500.0000 mL | Freq: Once | INTRAVENOUS | Status: AC
  Administered 2019-05-18: 500 mL via INTRAVENOUS

## 2019-05-18 MED ORDER — TERBUTALINE SULFATE 1 MG/ML IJ SOLN: 0.2500 mg | Freq: Once | INTRAMUSCULAR | Status: DC | PRN

## 2019-05-18 MED ORDER — OXYTOCIN BOLUS FROM INFUSION
500.0000 mL | Freq: Once | INTRAVENOUS | Status: AC
  Administered 2019-05-19: 500 mL via INTRAVENOUS

## 2019-05-18 MED ORDER — PHENYLEPHRINE 40 MCG/ML (10ML) SYRINGE FOR IV PUSH (FOR BLOOD PRESSURE SUPPORT): 80.0000 ug | PREFILLED_SYRINGE | INTRAVENOUS | Status: DC | PRN

## 2019-05-18 MED ORDER — LACTATED RINGERS IV SOLN: 500.0000 mL | INTRAVENOUS | Status: DC | PRN

## 2019-05-18 MED ORDER — LACTATED RINGERS IV SOLN: INTRAVENOUS | Status: DC

## 2019-05-18 MED ORDER — DIPHENHYDRAMINE HCL 50 MG/ML IJ SOLN: 12.5000 mg | INTRAMUSCULAR | Status: DC | PRN

## 2019-05-18 MED ORDER — LIDOCAINE HCL (PF) 1 % IJ SOLN
INTRAMUSCULAR | Status: DC | PRN
  Administered 2019-05-18: 5 mL via EPIDURAL

## 2019-05-18 MED ORDER — FENTANYL-BUPIVACAINE-NACL 0.5-0.125-0.9 MG/250ML-% EP SOLN
12.0000 mL/h | EPIDURAL | Status: DC | PRN
  Filled 2019-05-18: qty 250

## 2019-05-18 MED ORDER — SODIUM CHLORIDE (PF) 0.9 % IJ SOLN
INTRAMUSCULAR | Status: DC | PRN
  Administered 2019-05-18: 12 mL/h via EPIDURAL

## 2019-05-18 MED ORDER — LIDOCAINE HCL (PF) 1 % IJ SOLN: 30.0000 mL | INTRAMUSCULAR | Status: DC | PRN

## 2019-05-18 MED ORDER — OXYTOCIN 40 UNITS IN NORMAL SALINE INFUSION - SIMPLE MED: 2.5000 [IU]/h | INTRAVENOUS | Status: DC

## 2019-05-18 MED ORDER — SOD CITRATE-CITRIC ACID 500-334 MG/5ML PO SOLN: 30.0000 mL | ORAL | Status: DC | PRN

## 2019-05-18 MED ORDER — ACETAMINOPHEN 325 MG PO TABS: 650.0000 mg | ORAL_TABLET | ORAL | Status: DC | PRN

## 2019-05-18 MED ORDER — PHENYLEPHRINE 40 MCG/ML (10ML) SYRINGE FOR IV PUSH (FOR BLOOD PRESSURE SUPPORT)
80.0000 ug | PREFILLED_SYRINGE | INTRAVENOUS | Status: DC | PRN
  Filled 2019-05-18: qty 10

## 2019-05-18 MED ORDER — ONDANSETRON HCL 4 MG/2ML IJ SOLN
4.0000 mg | Freq: Four times a day (QID) | INTRAMUSCULAR | Status: DC | PRN
  Administered 2019-05-18: 4 mg via INTRAVENOUS
  Filled 2019-05-18: qty 2

## 2019-05-18 NOTE — Anesthesia Preprocedure Evaluation (Signed)
Anesthesia Evaluation  Patient identified by MRN, date of birth, ID band Patient awake    Reviewed: Allergy & Precautions, NPO status , Patient's Chart, lab work & pertinent test results  Airway Mallampati: II  TM Distance: >3 FB Neck ROM: Full    Dental no notable dental hx. (+) Teeth Intact, Dental Advisory Given   Pulmonary neg pulmonary ROS,    Pulmonary exam normal breath sounds clear to auscultation       Cardiovascular Exercise Tolerance: Good negative cardio ROS Normal cardiovascular exam Rhythm:Regular Rate:Normal     Neuro/Psych negative neurological ROS     GI/Hepatic negative GI ROS, Neg liver ROS,   Endo/Other  negative endocrine ROS  Renal/GU negative Renal ROS     Musculoskeletal   Abdominal   Peds  Hematology  (+) Sickle cell trait , Hgb 12.5 Plt 215   Anesthesia Other Findings [place] Speaking Hx of IUGR  Reproductive/Obstetrics (+) Pregnancy                             Anesthesia Physical Anesthesia Plan  ASA: II  Anesthesia Plan: Epidural   Post-op Pain Management:    Induction:   PONV Risk Score and Plan:   Airway Management Planned:   Additional Equipment:   Intra-op Plan:   Post-operative Plan:   Informed Consent: I have reviewed the patients History and Physical, chart, labs and discussed the procedure including the risks, benefits and alternatives for the proposed anesthesia with the patient or authorized representative who has indicated his/her understanding and acceptance.       Plan Discussed with:   Anesthesia Plan Comments: (39 5/7 wk G2P1 w hx of IUGR for LEA)        Anesthesia Quick Evaluation

## 2019-05-18 NOTE — Progress Notes (Signed)
S: Doing well, no complaints, pain increasing, limited relief from IV pain meds, last dose 2 hrs ago, ctx getting stronger, pt unsure about epidural (concerned about persistent shoulder pain after last epidural)  O: BP (!) 88/63   Pulse 89   Temp 98 F (36.7 C) (Oral)   Resp 20   Ht 5\' 6"  (1.676 m)   Wt 74.8 kg   LMP 08/13/2018 (Exact Date)   BMI 26.63 kg/m    FHT:  FHR: 140s bpm, variability: moderate,  accelerations:  Present,  decelerations:  Absent UC:   regular, every 3 minutes SVE:   Dilation: 4 Effacement (%): 50 Station: Ballotable Exam by:: Danielle Mink MD vtx to right but ballotable, no cord palpated but h/o funic presentation at 37 wks.  A / P:  24 y.o.  OB History  Gravida Para Term Preterm AB Living  2 1 1  0 0 1  SAB TAB Ectopic Multiple Live Births  0 0 0 0 1   at [redacted]w[redacted]d IOL, IUGR, now in active labor, continue pitocin  Will need rapid eval with SROM given h/o funic presentation (resolved on more recent u/s), pt aware of possibility of cord prolapse and need for c/s, no cord palpated at this time and bedside u/s on admit shows vtx w/o cord. Advantage of epidural d/w pt in this particular situation.   Fetal Wellbeing:  Category I Pain Control:  IV pain meds  Anticipated MOD:  NSVD  05/18/2019, 9:46 PM

## 2019-05-18 NOTE — Anesthesia Procedure Notes (Signed)
Epidural Patient location during procedure: OB Start time: 05/18/2019 11:39 PM End time: 05/18/2019 2:34 AM  Staffing Anesthesiologist: Trevor Iha, MD Performed: anesthesiologist   Preanesthetic Checklist Completed: patient identified, IV checked, site marked, risks and benefits discussed, surgical consent, monitors and equipment checked, pre-op evaluation and timeout performed  Epidural Patient position: sitting Prep: DuraPrep and site prepped and draped Patient monitoring: continuous pulse ox and blood pressure Approach: midline Location: L3-L4 Injection technique: LOR air  Needle:  Needle type: Tuohy  Needle gauge: 17 G Needle length: 9 cm and 9 Needle insertion depth: 7 cm Catheter type: closed end flexible Catheter size: 19 Gauge Catheter at skin depth: 12 cm Test dose: negative  Assessment Events: blood not aspirated, injection not painful, no injection resistance, no paresthesia and negative IV test  Additional Notes Patient identified. Risks/Benefits/Options discussed with patient including but not limited to bleeding, infection, nerve damage, paralysis, failed block, incomplete pain control, headache, blood pressure changes, nausea, vomiting, reactions to medication both or allergic, itching and postpartum back pain. Confirmed with bedside nurse the patient's most recent platelet count. Confirmed with patient that they are not currently taking any anticoagulation, have any bleeding history or any family history of bleeding disorders. Patient expressed understanding and wished to proceed. All questions were answered. Sterile technique was used throughout the entire procedure. Please see nursing notes for vital signs. Test dose was given through epidural needle and negative prior to continuing to dose epidural or start infusion. Warning signs of high block given to the patient including shortness of breath, tingling/numbness in hands, complete motor block, or any  concerning symptoms with instructions to call for help. Patient was given instructions on fall risk and not to get out of bed. All questions and concerns addressed with instructions to call with any issues.  1 Attempt (S) . Patient tolerated procedure well.

## 2019-05-19 ENCOUNTER — Encounter (HOSPITAL_COMMUNITY): Payer: Self-pay | Admitting: Obstetrics

## 2019-05-19 MED ORDER — PRENATAL MULTIVITAMIN CH
1.0000 | ORAL_TABLET | Freq: Every day | ORAL | Status: DC
  Administered 2019-05-19 – 2019-05-20 (×2): 1 via ORAL
  Filled 2019-05-19 (×2): qty 1

## 2019-05-19 MED ORDER — ACETAMINOPHEN 325 MG PO TABS
650.0000 mg | ORAL_TABLET | ORAL | Status: DC | PRN
  Administered 2019-05-19: 650 mg via ORAL
  Filled 2019-05-19: qty 2

## 2019-05-19 MED ORDER — IBUPROFEN 600 MG PO TABS
600.0000 mg | ORAL_TABLET | Freq: Four times a day (QID) | ORAL | Status: DC
  Administered 2019-05-19 – 2019-05-20 (×5): 600 mg via ORAL
  Filled 2019-05-19 (×5): qty 1

## 2019-05-19 MED ORDER — DIBUCAINE (PERIANAL) 1 % EX OINT: 1.0000 "application " | TOPICAL_OINTMENT | CUTANEOUS | Status: DC | PRN

## 2019-05-19 MED ORDER — SIMETHICONE 80 MG PO CHEW: 80.0000 mg | CHEWABLE_TABLET | ORAL | Status: DC | PRN

## 2019-05-19 MED ORDER — SENNOSIDES-DOCUSATE SODIUM 8.6-50 MG PO TABS
2.0000 | ORAL_TABLET | ORAL | Status: DC
  Administered 2019-05-19: 2 via ORAL
  Filled 2019-05-19: qty 2

## 2019-05-19 MED ORDER — WITCH HAZEL-GLYCERIN EX PADS: 1.0000 "application " | MEDICATED_PAD | CUTANEOUS | Status: DC | PRN

## 2019-05-19 MED ORDER — ZOLPIDEM TARTRATE 5 MG PO TABS: 5.0000 mg | ORAL_TABLET | Freq: Every evening | ORAL | Status: DC | PRN

## 2019-05-19 MED ORDER — TETANUS-DIPHTH-ACELL PERTUSSIS 5-2.5-18.5 LF-MCG/0.5 IM SUSP: 0.5000 mL | Freq: Once | INTRAMUSCULAR | Status: DC

## 2019-05-19 MED ORDER — DIPHENHYDRAMINE HCL 25 MG PO CAPS: 25.0000 mg | ORAL_CAPSULE | Freq: Four times a day (QID) | ORAL | Status: DC | PRN

## 2019-05-19 MED ORDER — ONDANSETRON HCL 4 MG PO TABS: 4.0000 mg | ORAL_TABLET | ORAL | Status: DC | PRN

## 2019-05-19 MED ORDER — OXYCODONE HCL 5 MG PO TABS: 10.0000 mg | ORAL_TABLET | ORAL | Status: DC | PRN

## 2019-05-19 MED ORDER — ONDANSETRON HCL 4 MG/2ML IJ SOLN: 4.0000 mg | INTRAMUSCULAR | Status: DC | PRN

## 2019-05-19 MED ORDER — OXYCODONE HCL 5 MG PO TABS: 5.0000 mg | ORAL_TABLET | ORAL | Status: DC | PRN

## 2019-05-19 MED ORDER — BENZOCAINE-MENTHOL 20-0.5 % EX AERO
1.0000 "application " | INHALATION_SPRAY | CUTANEOUS | Status: DC | PRN
  Administered 2019-05-19: 1 via TOPICAL
  Filled 2019-05-19: qty 56

## 2019-05-19 MED ORDER — COCONUT OIL OIL
1.0000 "application " | TOPICAL_OIL | Status: DC | PRN
  Administered 2019-05-19: 1 via TOPICAL

## 2019-05-19 NOTE — Anesthesia Postprocedure Evaluation (Signed)
Anesthesia Post Note  Patient: Marisa Pearson  Procedure(s) Performed: AN AD HOC LABOR EPIDURAL     Patient location during evaluation: Mother Baby Anesthesia Type: Epidural Level of consciousness: awake and alert Pain management: pain level not controlled Vital Signs Assessment: post-procedure vital signs reviewed and stable Respiratory status: spontaneous breathing, nonlabored ventilation and respiratory function stable Cardiovascular status: stable Postop Assessment: no headache, no backache and able to ambulate Anesthetic complications: no Comments: Patient states "I had no relief from epidural".  Patient reassured and apology given.  Labor was very fast.  Patient not upset with care received.    Last Vitals:  Vitals:   05/19/19 0350 05/19/19 0830  BP: 102/69 111/69  Pulse: 88 70  Resp: 17 16  Temp: 36.8 C 36.9 C  SpO2:  98%    Last Pain:  Vitals:   05/19/19 0830  TempSrc: Oral  PainSc: 0-No pain   Pain Goal:                Epidural/Spinal Function Cutaneous sensation: Normal sensation (05/19/19 0830), Patient able to flex knees: Yes (05/19/19 0830), Patient able to lift hips off bed: Yes (05/19/19 0830), Back pain beyond tenderness at insertion site: No (05/19/19 0830), Progressively worsening motor and/or sensory loss: No (05/19/19 0830), Bowel and/or bladder incontinence post epidural: No (05/19/19 0830)  Emmaline Kluver N

## 2019-05-19 NOTE — Lactation Note (Signed)
This note was copied from a baby's chart. Lactation Consultation Note  Patient Name: Marisa Pearson Date: 05/19/2019 Reason for consult: Initial assessment;Term  G2 baby Marisa "omar" is full term infant delivered vaginally now 22 hours ols.  Attempt to use [employer] Interpreter service for [place] interpreter..  Interpreter would come on, but then never talk.  Attempted three times.  Finally parents said they speak and understand some english. Parents did great.  Urged to feed on cue and 8-12 times day/showed mom how to hand express.  Able to see small glistenings of colostrum.  Mom with hx breast biopsy. Infant started cuing some past hand expression.  Assist mom with breastfeeding.  However infant would not latch.Opened his mouth and cues but then holds nipple in mouth  .  Left mom and baby STS.  Urged to call lactation as needed..   Maternal Data Has patient been taught Hand Expression?: Yes Does the patient have breastfeeding experience prior to this delivery?: Yes  Feeding Feeding Type: Breast Fed  LATCH Score Latch: Too sleepy or reluctant, no latch achieved, no sucking elicited.  Audible Swallowing: None  Type of Nipple: Everted at rest and after stimulation  Comfort (Breast/Nipple): Soft / non-tender  Hold (Positioning): Assistance needed to correctly position infant at breast and maintain latch.  LATCH Score: 5  Interventions Interventions: Breast feeding basics reviewed;Assisted with latch;Breast massage;Hand express  Lactation Tools Discussed/Used     Consult Status Consult Status: Follow-up Date: 05/20/19 Follow-up type: In-patient    [facility] Michaelle Copas 05/19/2019, 11:25 PM

## 2019-05-20 MED ORDER — IBUPROFEN 600 MG PO TABS: 600.0000 mg | ORAL_TABLET | Freq: Four times a day (QID) | ORAL | 1 refills | Status: DC

## 2019-05-20 NOTE — Discharge Summary (Signed)
OB Discharge Summary  Patient Name: Marisa Pearson DOB: 1995-04-13 MRN: [id number]  Date of admission: 05/18/2019 Delivering MD: Noland Fordyce   Date of discharge: 05/20/2019  Admitting diagnosis: Encounter for planned induction of labor [Z34.90] Intrauterine pregnancy: [redacted]w[redacted]d     Secondary diagnosis:Active Problems:   Encounter for planned induction of labor  Additional problems: None     Discharge diagnosis: Term Pregnancy Delivered                                                                     Post partum procedures:None  Augmentation: AROM and Pitocin  Complications: None  Hospital course:  Induction of Labor With Vaginal Delivery   24 y.o. yo G2P2002 at [redacted]w[redacted]d was admitted to the hospital 05/18/2019 for induction of labor.  Indication for induction: Favorable cervix at term and IUGR.  Patient had an uncomplicated labor course as follows: Membrane Rupture Time/Date: 12:15 AM ,05/19/2019   Intrapartum Procedures: Episiotomy: None [1]                                         Lacerations:  None [1]  Patient had delivery of a Viable infant.  Information for the patient's newborn:  Abdoul Aparna, [place] [[id number]]  Delivery Method: Vaginal, Spontaneous(Filed from Delivery Summary)    05/19/2019  Details of delivery can be found in separate delivery note.  Patient had a routine postpartum course. Patient is discharged home 05/20/19.  Physical exam  Vitals:   05/19/19 1104 05/19/19 1529 05/19/19 2053 05/20/19 0635  BP: 106/72 117/64 104/69 (!) 92/59  Pulse: 73 82 90 93  Resp: 16 16 18 18   Temp: 98.9 F (37.2 C) 98.6 F (37 C) 97.7 F (36.5 C) 98 F (36.7 C)  TempSrc: Oral Oral Oral Oral  SpO2: 99% 100%    Weight:      Height:       General: alert, cooperative and no distress Lochia: appropriate Uterine Fundus: firm Incision: N/A DVT Evaluation: No evidence of DVT seen on physical exam. Labs: Lab Results  Component Value Date   WBC 7.6  05/18/2019   HGB 12.5 05/18/2019   HCT 38.4 05/18/2019   MCV 77.4 (L) 05/18/2019   PLT 215 05/18/2019   No flowsheet data found.  Discharge instruction: per After Visit Summary and "Baby and Me Booklet".  After Visit Meds:  Allergies as of 05/20/2019      Reactions   Peanut-containing Drug Products Swelling   SWELLING REACTION UNSPECIFIED    Pork-derived Products Other (See Comments)   Muslim, no pork      Medication List    STOP taking these medications   oxyCODONE 5 MG immediate release tablet Commonly known as: Oxy IR/ROXICODONE     TAKE these medications   acetaminophen 325 MG tablet Commonly known as: TYLENOL Take 325 mg by mouth every 6 (six) hours as needed for headache.   ibuprofen 600 MG tablet Commonly known as: ADVIL Take 1 tablet (600 mg total) by mouth every 6 (six) hours.   prenatal multivitamin Tabs tablet Take 1 tablet by mouth daily at 12 noon.  Diet: routine diet  Activity: Advance as tolerated. Pelvic rest for 6 weeks.   Outpatient follow up:6 weeks Follow up Appt:No future appointments. Follow up visit: No follow-ups on file.  Postpartum contraception: Not Discussed  Newborn Data: Live born female  Birth Weight: 6 lb 8.5 oz (2963 g) APGAR: 8, 9  Newborn Delivery   Birth date/time: 05/19/2019 00:39:00 Delivery type: Vaginal, Spontaneous      Baby Feeding: Breast Disposition:home with mother   05/20/2019 Ala Dach, MD

## 2019-05-20 NOTE — Plan of Care (Signed)
  Problem: Education: Goal: Knowledge of General Education information will improve Description: Including pain rating scale, medication(s)/side effects and non-pharmacologic comfort measures Outcome: Completed/Met   Problem: Clinical Measurements: Goal: Ability to maintain clinical measurements within normal limits will improve Outcome: Completed/Met Goal: Will remain free from infection Outcome: Completed/Met Goal: Diagnostic test results will improve Outcome: Completed/Met Goal: Respiratory complications will improve Outcome: Completed/Met Goal: Cardiovascular complication will be avoided Outcome: Completed/Met   Problem: Activity: Goal: Risk for activity intolerance will decrease Outcome: Completed/Met   Problem: Nutrition: Goal: Adequate nutrition will be maintained Outcome: Completed/Met   Problem: Elimination: Goal: Will not experience complications related to bowel motility Outcome: Completed/Met Goal: Will not experience complications related to urinary retention Outcome: Completed/Met   Problem: Pain Managment: Goal: General experience of comfort will improve Outcome: Completed/Met   Problem: Safety: Goal: Ability to remain free from injury will improve Outcome: Completed/Met   Problem: Skin Integrity: Goal: Risk for impaired skin integrity will decrease Outcome: Completed/Met   Problem: Education: Goal: Knowledge of condition will improve Outcome: Completed/Met   Problem: Activity: Goal: Will verbalize the importance of balancing activity with adequate rest periods Outcome: Completed/Met Goal: Ability to tolerate increased activity will improve Outcome: Completed/Met   Problem: Life Cycle: Goal: Chance of risk for complications during the postpartum period will decrease Outcome: Completed/Met   Problem: Role Relationship: Goal: Ability to demonstrate positive interaction with newborn will improve Outcome: Completed/Met

## 2019-05-20 NOTE — Plan of Care (Signed)
  Problem: Education: Goal: Knowledge of General Education information will improve Description: Including pain rating scale, medication(s)/side effects and non-pharmacologic comfort measures Outcome: Completed/Met   Problem: Clinical Measurements: Goal: Ability to maintain clinical measurements within normal limits will improve Outcome: Completed/Met Goal: Will remain free from infection Outcome: Completed/Met Goal: Diagnostic test results will improve Outcome: Completed/Met Goal: Respiratory complications will improve Outcome: Completed/Met Goal: Cardiovascular complication will be avoided Outcome: Completed/Met   Problem: Activity: Goal: Risk for activity intolerance will decrease Outcome: Completed/Met   Problem: Nutrition: Goal: Adequate nutrition will be maintained Outcome: Completed/Met   Problem: Elimination: Goal: Will not experience complications related to bowel motility Outcome: Completed/Met Goal: Will not experience complications related to urinary retention Outcome: Completed/Met   Problem: Pain Managment: Goal: General experience of comfort will improve Outcome: Completed/Met   Problem: Safety: Goal: Ability to remain free from injury will improve Outcome: Completed/Met   Problem: Skin Integrity: Goal: Risk for impaired skin integrity will decrease Outcome: Completed/Met   Problem: Education: Goal: Knowledge of condition will improve Outcome: Completed/Met   Problem: Activity: Goal: Will verbalize the importance of balancing activity with adequate rest periods Outcome: Completed/Met Goal: Ability to tolerate increased activity will improve Outcome: Completed/Met   Problem: Role Relationship: Goal: Ability to demonstrate positive interaction with newborn will improve Outcome: Completed/Met

## 2019-06-28 DIAGNOSIS — Z3042 Encounter for surveillance of injectable contraceptive: Secondary | ICD-10-CM | POA: Diagnosis not present

## 2019-09-13 DIAGNOSIS — Z3042 Encounter for surveillance of injectable contraceptive: Secondary | ICD-10-CM | POA: Diagnosis not present

## 2019-12-18 DIAGNOSIS — Z30430 Encounter for insertion of intrauterine contraceptive device: Secondary | ICD-10-CM | POA: Diagnosis not present

## 2019-12-18 DIAGNOSIS — Z3202 Encounter for pregnancy test, result negative: Secondary | ICD-10-CM | POA: Diagnosis not present

## 2020-01-15 DIAGNOSIS — R102 Pelvic and perineal pain: Secondary | ICD-10-CM | POA: Diagnosis not present

## 2020-01-15 DIAGNOSIS — N898 Other specified noninflammatory disorders of vagina: Secondary | ICD-10-CM | POA: Diagnosis not present

## 2020-01-15 DIAGNOSIS — Z30431 Encounter for routine checking of intrauterine contraceptive device: Secondary | ICD-10-CM | POA: Diagnosis not present

## 2020-02-06 DIAGNOSIS — Z3202 Encounter for pregnancy test, result negative: Secondary | ICD-10-CM | POA: Diagnosis not present

## 2020-02-06 DIAGNOSIS — R109 Unspecified abdominal pain: Secondary | ICD-10-CM | POA: Diagnosis not present

## 2020-02-06 DIAGNOSIS — Z3009 Encounter for other general counseling and advice on contraception: Secondary | ICD-10-CM | POA: Diagnosis not present

## 2020-02-06 DIAGNOSIS — T8332XA Displacement of intrauterine contraceptive device, initial encounter: Secondary | ICD-10-CM | POA: Diagnosis not present

## 2020-02-06 DIAGNOSIS — Z3042 Encounter for surveillance of injectable contraceptive: Secondary | ICD-10-CM | POA: Diagnosis not present

## 2020-10-07 ENCOUNTER — Encounter: Payer: Self-pay | Admitting: Radiology

## 2020-10-14 ENCOUNTER — Other Ambulatory Visit (HOSPITAL_COMMUNITY)
Admission: RE | Admit: 2020-10-14 | Discharge: 2020-10-14 | Disposition: A | Discharge status: Home / Self Care | Payer: 59 | Source: Ambulatory Visit | Attending: Obstetrics & Gynecology | Admitting: Obstetrics & Gynecology

## 2020-10-14 ENCOUNTER — Ambulatory Visit (INDEPENDENT_AMBULATORY_CARE_PROVIDER_SITE_OTHER): Payer: 59 | Admitting: Obstetrics & Gynecology

## 2020-10-14 ENCOUNTER — Other Ambulatory Visit: Payer: Self-pay

## 2020-10-14 ENCOUNTER — Encounter: Payer: Self-pay | Admitting: Obstetrics & Gynecology

## 2020-10-14 VITALS — BP 138/92 | HR 84 | Wt 182.0 lb

## 2020-10-14 DIAGNOSIS — Z124 Encounter for screening for malignant neoplasm of cervix: Secondary | ICD-10-CM

## 2020-10-14 DIAGNOSIS — Z3009 Encounter for other general counseling and advice on contraception: Secondary | ICD-10-CM | POA: Diagnosis not present

## 2020-10-14 DIAGNOSIS — Z3202 Encounter for pregnancy test, result negative: Secondary | ICD-10-CM

## 2020-10-14 LAB — POCT URINE PREGNANCY: Preg Test, Ur: NEGATIVE

## 2020-10-14 NOTE — Progress Notes (Signed)
GYNECOLOGY OFFICE VISIT NOTE  History:   Marisa Pearson is a 25 y.o. [phone] here today for discussion about family planning. History of two SVDs, last one in 05/19/19.  Has been on Depo Provera since then, but this caused amenorrhea, weight gain and acne. Last shot was 07/23/20, has not had periods resume yet.  In the past, she has tried OCPs, Liletta IUD, Nexplanon, ring, patch which caused a myriad of symptoms including irregular bleeding and weight gain. Desires nonhormonal IUD that she can use of one year until she is done with her Associate's degree.   She denies any abnormal vaginal discharge, bleeding, pelvic pain or other concerns.    Past Medical History:  Diagnosis Date   Medical history non-contributory     Past Surgical History:  Procedure Laterality Date   EXCISION OF BREAST BIOPSY Bilateral 11/28/2018   Procedure: EXCISION OF BILATERAL AXILLARY ACCESSORY BREAST TISSUE;  Surgeon: Donnie Mesa, MD;  Location: [facility];  Service: General;  Laterality: Bilateral;   OB History  Gravida Para Term Preterm AB Living  2 2 2  0 0 2  SAB IAB Ectopic Multiple Live Births  0 0 0 0 2    # Outcome Date GA Lbr Len/2nd Weight Sex Delivery Anes PTL Lv  2 Term 05/19/19 [id number]:34 / 00:05 6 lb 8.5 oz (2.963 kg) M Vag-Spont EPI  LIV     Name: ABDOUL AZIZ GARBA,BOY Azure     Apgar1: 8  Apgar5: 9  1 Term 02/04/17 33w5d7:04 / 00:32 6 lb 8.9 oz (2.974 kg) M Vag-Spont EPI  LIV     Name: ABDOUL AZIZ GARBA,BOYLEYLA     Apgar1: 9 [place]9   The following portions of the patient's history were reviewed and updated as appropriate: allergies, current medications, past family history, past medical history, past social history, past surgical history and problem list.   Health Maintenance:  Normal [facility]18/2017[facility]  Review of Systems:  Pertinent items noted in HPI and remainder of comprehensive ROS otherwise negative.  Physical Exam:  BP (!) 138/92   Pulse 84    Wt 182 lb (82.6 kg)   LMP  (LMP Unknown)   BMI 29.38 kg/m  CONSTITUTIONAL: Well-developed, well-nourished female in no acute distress.  SKIN: No rash noted. Not diaphoretic. No erythema. No pallor. MUSCULOSKELETAL: Normal range of motion. No edema noted. NEUROLOGIC: Alert and oriented to person, place, and time. Normal muscle tone coordination. No cranial nerve deficit noted. PSYCHIATRIC: Normal mood and affect. Normal behavior. Normal judgment and thought content. CARDIOVASCULAR: Normal heart rate noted RESPIRATORY: Effort and breath sounds normal, no problems with respiration noted ABDOMEN: No masses noted. No other overt distention noted.   PELVIC: Normal appearing external genitalia; normal urethral meatus; normal appearing vaginal mucosa and cervix.  No abnormal discharge noted. Pap smear obtained. Performed in the presence of a chaperone  Labs and Imaging Results for orders placed or performed in visit on 10/14/20 (from the past 168 hour(s))  POCT urine pregnancy   Collection Time: 10/14/20  4:09 PM  Result Value Ref Range   Preg Test, Ur Negative Negative   No results found.    Assessment and Plan:    1. Pap smear for cervical cancer screening - Cytology - PAP done, will follow up results and manage accordingly.  2. Family planning Talked about using condoms and contraceptive gel, Phexxi samples given to her.  If she and her husband tolerate this  well, we can help them order it.  Also advised to take multivitamins with folic acid, this is not the most effective birth control and conception can happen. She understands this and wants to proceed with this modality.  Assured that periods can resume 6-12 months after Depo provera cessation, can also use Fertility Awareness apps to help with family planning once periods resume.   Routine preventative health maintenance measures emphasized. Please refer to After Visit Summary for other counseling recommendations.   Return for any  gynecologic concerns.    I spent 23 minutes dedicated to the care of this patient including pre-visit review of records, face to face time with the patient discussing her conditions and treatments and post visit ordering of testing.    Verita Schneiders, MD, [place] for [employer], [place]

## 2020-10-14 NOTE — Patient Instructions (Signed)
Thank you for enrolling in MyChart. Please follow the instructions below to securely access your online medical record. MyChart allows you to send messages to your doctor, view your test results, manage appointments, and more.   How Do I Sign Up? In your Internet browser, go to [employer] and enter [email or web address]. Click on the Sign Up Now link in the Sign In box. You will see the New Member Sign Up page. Enter your MyChart Access Code exactly as it appears below. You will not need to use this code after you've completed the sign-up process. If you do not sign up before the expiration date, you must request a new code.  MyChart Access Code: [email or web address] Expires: 11/28/2020  4:44 PM  Enter your Social Security Number ([id number]) and Date of Birth (mm/dd/yyyy) as indicated and click Submit. You will be taken to the next sign-up page. Create a MyChart ID. This will be your MyChart login ID and cannot be changed, so think of one that is secure and easy to remember. Create a Clinical biochemist. You can change your password at any time. Enter your Password Reset Question and Answer. This can be used at a later time if you forget your password.  Enter your e-mail address. You will receive e-mail notification when new information is available in MyChart. Click Sign Up. You can now view your medical record.   Additional Information Remember, MyChart is NOT to be used for urgent needs. For medical emergencies, dial 911.

## 2020-10-21 LAB — CYTOLOGY - PAP
Comment: NEGATIVE
Diagnosis: NEGATIVE
High risk HPV: NEGATIVE

## 2020-11-06 ENCOUNTER — Other Ambulatory Visit: Payer: Self-pay | Admitting: Obstetrics & Gynecology

## 2020-11-06 DIAGNOSIS — Z30018 Encounter for initial prescription of other contraceptives: Secondary | ICD-10-CM

## 2020-11-06 MED ORDER — PHEXXI 1.8-1-0.4 % VA GEL: VAGINAL | 11 refills | Status: DC

## 2020-11-06 NOTE — Progress Notes (Signed)
Patient desired Phexxi prescription, this  sent it for her.  If there are problems filling this, may have to send to specialty pharmacy.  Patient can also get more samples from the office if needed in the meantime.    Jaynie Collins, MD

## 2020-11-27 ENCOUNTER — Other Ambulatory Visit: Payer: Self-pay | Admitting: *Deleted

## 2020-11-27 DIAGNOSIS — Z30018 Encounter for initial prescription of other contraceptives: Secondary | ICD-10-CM

## 2020-11-27 MED ORDER — PHEXXI 1.8-1-0.4 % VA GEL: VAGINAL | 11 refills | Status: DC

## 2020-11-27 NOTE — Progress Notes (Signed)
Resent into new pharmacy

## 2020-12-01 ENCOUNTER — Encounter: Payer: Self-pay | Admitting: *Deleted

## 2020-12-11 ENCOUNTER — Other Ambulatory Visit (HOSPITAL_COMMUNITY)
Admission: RE | Admit: 2020-12-11 | Discharge: 2020-12-11 | Disposition: A | Discharge status: Home / Self Care | Payer: 59 | Source: Ambulatory Visit | Attending: Family Medicine | Admitting: Family Medicine

## 2020-12-11 ENCOUNTER — Other Ambulatory Visit: Payer: Self-pay

## 2020-12-11 ENCOUNTER — Ambulatory Visit (INDEPENDENT_AMBULATORY_CARE_PROVIDER_SITE_OTHER): Payer: 59 | Admitting: Family Medicine

## 2020-12-11 ENCOUNTER — Encounter: Payer: Self-pay | Admitting: Family Medicine

## 2020-12-11 VITALS — BP 113/78 | HR 84 | Wt 187.0 lb

## 2020-12-11 DIAGNOSIS — R102 Pelvic and perineal pain: Secondary | ICD-10-CM | POA: Insufficient documentation

## 2020-12-11 MED ORDER — DOXYCYCLINE HYCLATE 100 MG PO CAPS: 100.0000 mg | ORAL_CAPSULE | Freq: Two times a day (BID) | ORAL | 0 refills | Status: DC

## 2020-12-11 NOTE — Progress Notes (Signed)
   Subjective:    Patient ID: Marisa Pearson is a 25 y.o. female presenting with Pelvic Pain  on 12/11/2020  HPI: Stopped her Depo in April. Has not had her cycle back. Using Phexxi which is working ok. Has 1 week h/o low abdominal pain. Has h/o cyst on her ovary 1.5 years ago. Feels crampy but stronger, there constantly. Nothing improves it and nothing worsens it. Feels like her former cyst.  Review of Systems  Constitutional:  Negative for chills and fever.  Respiratory:  Negative for shortness of breath.   Cardiovascular:  Negative for chest pain.  Gastrointestinal:  Negative for abdominal pain, nausea and vomiting.  Genitourinary:  Positive for pelvic pain. Negative for dysuria.  Skin:  Negative for rash.     Objective:    BP 113/78   Pulse 84   Wt 187 lb (84.8 kg)   BMI 30.18 kg/m  Physical Exam Constitutional:      General: She is not in acute distress.    Appearance: She is well-developed.  HENT:     Head: Normocephalic and atraumatic.  Eyes:     General: No scleral icterus. Cardiovascular:     Rate and Rhythm: Normal rate.  Pulmonary:     Effort: Pulmonary effort is normal.  Abdominal:     Palpations: Abdomen is soft.  Musculoskeletal:     Cervical back: Neck supple.  Skin:    General: Skin is warm and dry.  Neurological:     Mental Status: She is alert and oriented to person, place, and time.        Assessment & Plan:   Problem List Items Addressed This Visit       Unprioritized   Pelvic pain - Primary    Diffusely tender on exam, check cultures and treat presumptively with doxy. Check u/s.      Relevant Medications   doxycycline (VIBRAMYCIN) 100 MG capsule   Other Relevant Orders   US PELVIC COMPLETE WITH TRANSVAGINAL   Cervicovaginal ancillary only( [facility] [place])    Return in about 4 weeks (around 01/08/2021) for needs U/S, a follow-up.  Reva Bores 12/11/2020 10:51 AM

## 2020-12-11 NOTE — Assessment & Plan Note (Signed)
Diffusely tender on exam, check cultures and treat presumptively with doxy. Check u/s.

## 2020-12-11 NOTE — Progress Notes (Signed)
Pelvic pain for the last week. Pt states she did have cysts in the past. Denies any abnormal bleeding

## 2020-12-12 LAB — CERVICOVAGINAL ANCILLARY ONLY
Bacterial Vaginitis (gardnerella): POSITIVE — AB
Candida Glabrata: NEGATIVE
Candida Vaginitis: NEGATIVE
Chlamydia: NEGATIVE
Comment: NEGATIVE
Comment: NEGATIVE
Comment: NEGATIVE
Comment: NEGATIVE
Comment: NEGATIVE
Comment: NORMAL
Neisseria Gonorrhea: NEGATIVE
Trichomonas: NEGATIVE

## 2020-12-12 MED ORDER — METRONIDAZOLE 500 MG PO TABS: 500.0000 mg | ORAL_TABLET | Freq: Two times a day (BID) | ORAL | 0 refills | Status: DC

## 2020-12-12 NOTE — Addendum Note (Signed)
Addended by: Reva Bores on: 12/12/2020 06:16 PM   Modules accepted: Orders

## 2020-12-22 ENCOUNTER — Ambulatory Visit: Payer: 59

## 2020-12-23 ENCOUNTER — Other Ambulatory Visit: Payer: Self-pay

## 2020-12-23 ENCOUNTER — Encounter: Payer: Self-pay | Admitting: Radiology

## 2020-12-23 ENCOUNTER — Ambulatory Visit
Admission: RE | Admit: 2020-12-23 | Discharge: 2020-12-23 | Disposition: A | Discharge status: Home / Self Care | Payer: 59 | Source: Ambulatory Visit | Attending: Family Medicine | Admitting: Family Medicine

## 2020-12-23 DIAGNOSIS — R102 Pelvic and perineal pain: Secondary | ICD-10-CM | POA: Diagnosis present

## 2021-01-13 ENCOUNTER — Ambulatory Visit: Payer: 59 | Admitting: Family Medicine

## 2021-01-20 ENCOUNTER — Ambulatory Visit (INDEPENDENT_AMBULATORY_CARE_PROVIDER_SITE_OTHER): Payer: 59 | Admitting: Obstetrics & Gynecology

## 2021-01-20 ENCOUNTER — Encounter: Payer: Self-pay | Admitting: Obstetrics & Gynecology

## 2021-01-20 ENCOUNTER — Other Ambulatory Visit: Payer: Self-pay

## 2021-01-20 VITALS — BP 121/77 | HR 77 | Wt 190.0 lb

## 2021-01-20 DIAGNOSIS — N83201 Unspecified ovarian cyst, right side: Secondary | ICD-10-CM

## 2021-01-20 DIAGNOSIS — Z23 Encounter for immunization: Secondary | ICD-10-CM

## 2021-01-20 DIAGNOSIS — Z30011 Encounter for initial prescription of contraceptive pills: Secondary | ICD-10-CM | POA: Diagnosis not present

## 2021-01-20 DIAGNOSIS — R102 Pelvic and perineal pain: Secondary | ICD-10-CM

## 2021-01-20 DIAGNOSIS — N83202 Unspecified ovarian cyst, left side: Secondary | ICD-10-CM

## 2021-01-20 LAB — POCT URINE PREGNANCY: Preg Test, Ur: NEGATIVE

## 2021-01-20 MED ORDER — DROSPIRENONE-ETHINYL ESTRADIOL 3-0.02 MG PO TABS: 1.0000 | ORAL_TABLET | Freq: Every day | ORAL | 11 refills | Status: DC

## 2021-01-20 NOTE — Progress Notes (Signed)
Patient here to discuss U/S results from 12/23/20.  Last pap: 10/14/20 WNL   Pt states she wants to discuss birth control options. Pt has concerns birth w/ acne   CC: Pelvic pain.

## 2021-01-20 NOTE — Progress Notes (Signed)
GYNECOLOGY OFFICE VISIT NOTE  History:   Marisa Pearson is a 25 y.o. [phone] here today to discuss results of recent ultrasound, done for pelvic pain and history of ovarian cysts in the past.  Today, she reports mild occasional pain.  Interested in OCPs for suppression of cysts and for treatment of acne.   She denies any abnormal vaginal discharge, bleeding, pelvic pain or other concerns.    Past Medical History:  Diagnosis Date   Medical history non-contributory     Past Surgical History:  Procedure Laterality Date   EXCISION OF BREAST BIOPSY Bilateral 11/28/2018   Procedure: EXCISION OF BILATERAL AXILLARY ACCESSORY BREAST TISSUE;  Surgeon: Manus Rudd, MD;  Location: [facility] OR;  Service: General;  Laterality: Bilateral;    The following portions of the patient's history were reviewed and updated as appropriate: allergies, current medications, past family history, past medical history, past social history, past surgical history and problem list.   Health Maintenance:  Normal pap on 10/14/2020.  Review of Systems:  Pertinent items noted in HPI and remainder of comprehensive ROS otherwise negative.  Physical Exam:  BP 121/77   Pulse 77   Wt 190 lb (86.2 kg)   BMI 30.67 kg/m  CONSTITUTIONAL: Well-developed, well-nourished female in no acute distress.  HEENT:  Normocephalic, atraumatic. External right and left ear normal. No scleral icterus.  NECK: Normal range of motion, supple, no masses noted on observation SKIN: No rash noted. Not diaphoretic. No erythema. No pallor. MUSCULOSKELETAL: Normal range of motion. No edema noted. NEUROLOGIC: Alert and oriented to person, place, and time. Normal muscle tone coordination. No cranial nerve deficit noted. PSYCHIATRIC: Normal mood and affect. Normal behavior. Normal judgment and thought content. CARDIOVASCULAR: Normal heart rate noted RESPIRATORY: Effort and breath sounds normal, no problems with respiration noted ABDOMEN: No  masses noted. No other overt distention noted.   PELVIC: Deferred  Labs and Imaging Results for orders placed or performed in visit on 01/20/21 (from the past 168 hour(s))  POCT urine pregnancy   Collection Time: 01/20/21  3:26 PM  Result Value Ref Range   Preg Test, Ur Negative Negative   US PELVIC COMPLETE WITH TRANSVAGINAL  Result Date: 12/23/2020 CLINICAL DATA:  Initial evaluation for pelvic pain. EXAM: TRANSABDOMINAL AND TRANSVAGINAL ULTRASOUND OF PELVIS TECHNIQUE: Both transabdominal and transvaginal ultrasound examinations of the pelvis were performed. Transabdominal technique was performed for global imaging of the pelvis including uterus, ovaries, adnexal regions, and pelvic cul-de-sac. It was necessary to proceed with endovaginal exam following the transabdominal exam to visualize the uterus, endometrium, and ovaries. COMPARISON:  None FINDINGS: Uterus Measurements: 8.9 x 4.5 x 6.5 cm = volume: 135.2 mL. Uterus is anteverted. No discrete fibroid or other mass. Endometrium Thickness: 3.9 mm.  No focal abnormality visualized. Right ovary Measurements: 4.0 x 2.9 x 3.1 cm = volume: 18.5 mL. 1.2 x 1.2 x 1.4 cm simple cyst seen extending from the right ovary, which could reflect an exophytic follicular cyst versus paraovarian cyst. No significant internal complexity, vascularity, or solid nodularity. Left ovary Measurements: 4.2 x 4.3 x 3.6 cm = volume: 34.4 mL. 3.3 x 3.1 x 2.9 cm simple cyst. No significant internal complexity, vascularity, or solid nodularity. Other findings No abnormal free fluid. IMPRESSION: 1. Simple bilateral adnexal cysts measuring up to 1.4 cm on the right and 3.3 cm on the left. These are almost certainly benign given size and appearance, with no follow-up imaging recommended. Note: This recommendation does not apply to premenarchal patients  or to those with increased risk (genetic, family history, elevated tumor markers or other high-risk factors) of ovarian cancer.  Reference: Radiology 2019 Nov; 293(2):359-371. 2. Normal sonographic appearance of the uterus and endometrium. 3. No other acute abnormality within the pelvis. Electronically Signed   By: Rise Mu M.D.   On: 12/23/2020 20:10      Assessment and Plan:     1. Simple cysts of both ovaries 2. Pelvic pain 3. Oral contraception initiation Ultrasound results reviewed. Reassured patient that the cysts are physiologic, no surgical intervention needed Discussed ovarian cyst hormonal suppression, Yaz prescribed to help with this. - drospirenone-ethinyl estradiol (YAZ) 3-0.02 MG tablet; Take 1 tablet by mouth daily.  Dispense: 28 tablet; Refill: 11  4. Flu vaccine need - Flu Vaccine QUAD 36+ mos IM (Fluarix, Fluzone & Afluria Quad PF  Routine preventative health maintenance measures emphasized. Please refer to After Visit Summary for other counseling recommendations.   Return in about 2 months (around 03/22/2021) for OCP Check .    I spent 20 minutes dedicated to the care of this patient including pre-visit review of records, face to face time with the patient discussing her conditions and treatments and post visit orders.    Jaynie Collins, MD, FACOG Obstetrician & Gynecologist, [facility] for [employer], [facility]

## 2021-03-19 ENCOUNTER — Other Ambulatory Visit: Payer: Self-pay | Admitting: *Deleted

## 2021-03-19 MED ORDER — NYSTATIN 100000 UNIT/GM EX CREA: 1.0000 "application " | TOPICAL_CREAM | Freq: Two times a day (BID) | CUTANEOUS | 1 refills | Status: DC

## 2021-03-19 MED ORDER — FLUCONAZOLE 150 MG PO TABS: 150.0000 mg | ORAL_TABLET | Freq: Once | ORAL | 3 refills | Status: AC

## 2021-03-19 NOTE — Progress Notes (Signed)
Pt called requesting meds for yeast due to wearing a pad long term

## 2021-07-28 ENCOUNTER — Telehealth: Payer: Self-pay | Admitting: *Deleted

## 2021-07-28 NOTE — Telephone Encounter (Signed)
Pt called wanting to set up appt to restart depo, she is not taking the pills like she should. Okay per Dr A to have pt come in for UPT and restart depo.  ?

## 2021-07-29 ENCOUNTER — Telehealth: Payer: Self-pay

## 2021-07-29 NOTE — Telephone Encounter (Signed)
Left message for pt to call the office back regarding depo injection.  ?

## 2021-07-30 ENCOUNTER — Ambulatory Visit (INDEPENDENT_AMBULATORY_CARE_PROVIDER_SITE_OTHER): Payer: 59

## 2021-07-30 VITALS — BP 127/82 | HR 93 | Wt 183.0 lb

## 2021-07-30 DIAGNOSIS — Z3042 Encounter for surveillance of injectable contraceptive: Secondary | ICD-10-CM

## 2021-07-30 LAB — POCT URINE PREGNANCY: Preg Test, Ur: NEGATIVE

## 2021-07-30 MED ORDER — MEDROXYPROGESTERONE ACETATE 150 MG/ML IM SUSP
150.0000 mg | INTRAMUSCULAR | Status: DC
  Administered 2021-07-30: 150 mg via INTRAMUSCULAR

## 2021-07-30 MED ORDER — MEDROXYPROGESTERONE ACETATE 150 MG/ML IM SUSP: 150.0000 mg | INTRAMUSCULAR | 4 refills | Status: DC

## 2021-07-30 NOTE — Progress Notes (Signed)
Pt presents for Depo Restart. ?Ok per provider to give Depo.   ? ?UPT: Negative  ? ?Date last pap: 10/14/2020. ?Last Depo-Provera: N/A. ?Side Effects if any: None . ?Serum HCG indicated? N/A. ?Depo-Provera 150 mg IM given by: Kennon Portela CMA . ?Next appointment due 07/13/-07/27.  ?

## 2021-10-16 ENCOUNTER — Ambulatory Visit: Payer: 59

## 2021-12-05 ENCOUNTER — Other Ambulatory Visit: Payer: Self-pay | Admitting: Obstetrics & Gynecology

## 2021-12-05 DIAGNOSIS — Z30018 Encounter for initial prescription of other contraceptives: Secondary | ICD-10-CM

## 2022-02-09 ENCOUNTER — Ambulatory Visit (INDEPENDENT_AMBULATORY_CARE_PROVIDER_SITE_OTHER): Payer: Medicaid Other | Admitting: *Deleted

## 2022-02-09 ENCOUNTER — Encounter: Payer: Self-pay | Admitting: Obstetrics & Gynecology

## 2022-02-09 VITALS — BP 129/84 | HR 85 | Wt 190.0 lb

## 2022-02-09 DIAGNOSIS — N912 Amenorrhea, unspecified: Secondary | ICD-10-CM | POA: Diagnosis not present

## 2022-02-09 LAB — POCT URINE PREGNANCY: Preg Test, Ur: POSITIVE — AB

## 2022-02-09 NOTE — Progress Notes (Signed)
Marisa Abdoul Kathalene Pearson here for a UPT. Pt had a positive upt at home. LMP is unsure as pt was on Depo and missed her last dose. [name]     UPT in office Positive. Will order an HCG to get better idea of weeks gestation and then schedule a viability scan.   Crosby Oyster, RN

## 2022-02-10 ENCOUNTER — Telehealth: Payer: Self-pay

## 2022-02-10 LAB — BETA HCG QUANT (REF LAB): hCG Quant: 160 m[IU]/mL

## 2022-02-10 NOTE — Telephone Encounter (Signed)
Pt states she will be traveling out of the country due in December.   Patient asked are there preventative medications or injections she needs before going out of the Country. I told her I was not sure and I would consult with a provider As you mentioned pt is early in pregnancy at this time.

## 2022-02-15 ENCOUNTER — Other Ambulatory Visit: Payer: Self-pay | Admitting: Family Medicine

## 2022-02-15 ENCOUNTER — Ambulatory Visit
Admission: RE | Admit: 2022-02-15 | Discharge: 2022-02-15 | Disposition: A | Discharge status: Home / Self Care | Payer: Medicaid Other | Source: Ambulatory Visit | Attending: Family Medicine | Admitting: Family Medicine

## 2022-02-15 DIAGNOSIS — R1031 Right lower quadrant pain: Secondary | ICD-10-CM | POA: Insufficient documentation

## 2022-02-15 DIAGNOSIS — Z3A01 Less than 8 weeks gestation of pregnancy: Secondary | ICD-10-CM

## 2022-02-15 DIAGNOSIS — N83201 Unspecified ovarian cyst, right side: Secondary | ICD-10-CM | POA: Diagnosis not present

## 2022-02-15 DIAGNOSIS — R102 Pelvic and perineal pain: Secondary | ICD-10-CM

## 2022-02-15 DIAGNOSIS — R1032 Left lower quadrant pain: Secondary | ICD-10-CM | POA: Diagnosis not present

## 2022-02-15 DIAGNOSIS — N898 Other specified noninflammatory disorders of vagina: Secondary | ICD-10-CM | POA: Diagnosis not present

## 2022-02-16 ENCOUNTER — Inpatient Hospital Stay (HOSPITAL_COMMUNITY): Payer: Medicaid Other

## 2022-02-16 ENCOUNTER — Inpatient Hospital Stay (HOSPITAL_COMMUNITY)
Admission: AD | Admit: 2022-02-16 | Discharge: 2022-02-17 | Disposition: A | Discharge status: Home / Self Care | Payer: Medicaid Other | Attending: Obstetrics and Gynecology | Admitting: Obstetrics and Gynecology

## 2022-02-16 ENCOUNTER — Encounter (HOSPITAL_COMMUNITY): Payer: Self-pay

## 2022-02-16 DIAGNOSIS — R1030 Lower abdominal pain, unspecified: Secondary | ICD-10-CM | POA: Diagnosis not present

## 2022-02-16 DIAGNOSIS — O3481 Maternal care for other abnormalities of pelvic organs, first trimester: Secondary | ICD-10-CM | POA: Insufficient documentation

## 2022-02-16 DIAGNOSIS — Z3490 Encounter for supervision of normal pregnancy, unspecified, unspecified trimester: Secondary | ICD-10-CM

## 2022-02-16 DIAGNOSIS — O26891 Other specified pregnancy related conditions, first trimester: Secondary | ICD-10-CM | POA: Insufficient documentation

## 2022-02-16 DIAGNOSIS — N83202 Unspecified ovarian cyst, left side: Secondary | ICD-10-CM | POA: Diagnosis not present

## 2022-02-16 DIAGNOSIS — R102 Pelvic and perineal pain: Secondary | ICD-10-CM | POA: Insufficient documentation

## 2022-02-16 DIAGNOSIS — Z3A01 Less than 8 weeks gestation of pregnancy: Secondary | ICD-10-CM | POA: Diagnosis not present

## 2022-02-16 DIAGNOSIS — Z6740 Type O blood, Rh positive: Secondary | ICD-10-CM

## 2022-02-16 DIAGNOSIS — O208 Other hemorrhage in early pregnancy: Secondary | ICD-10-CM | POA: Insufficient documentation

## 2022-02-16 LAB — COMPREHENSIVE METABOLIC PANEL WITH GFR
ALT: 18 U/L (ref 0–44)
AST: 15 U/L (ref 15–41)
Albumin: 3.9 g/dL (ref 3.5–5.0)
Alkaline Phosphatase: 49 U/L (ref 38–126)
Anion gap: 9 (ref 5–15)
BUN: 7 mg/dL (ref 6–20)
CO2: 21 mmol/L — ABNORMAL LOW (ref 22–32)
Calcium: 9.8 mg/dL (ref 8.9–10.3)
Chloride: 106 mmol/L (ref 98–111)
Creatinine, Ser: 0.61 mg/dL (ref 0.44–1.00)
GFR, Estimated: >60 mL/min
Glucose, Bld: 76 mg/dL (ref 70–99)
Potassium: 4.1 mmol/L (ref 3.5–5.1)
Sodium: 136 mmol/L (ref 135–145)
Total Bilirubin: 0.4 mg/dL (ref 0.3–1.2)
Total Protein: 7.3 g/dL (ref 6.5–8.1)

## 2022-02-16 LAB — HCG, QUANTITATIVE, PREGNANCY: hCG, Beta Chain, Quant, S: 4163 m[IU]/mL — ABNORMAL HIGH (ref <5)

## 2022-02-16 LAB — URINALYSIS, ROUTINE W REFLEX MICROSCOPIC
Bilirubin Urine: NEGATIVE
Glucose, UA: NEGATIVE mg/dL
Hgb urine dipstick: NEGATIVE
Ketones, ur: NEGATIVE mg/dL
Nitrite: NEGATIVE
Protein, ur: NEGATIVE mg/dL
Specific Gravity, Urine: 1.016 (ref 1.005–1.030)
pH: 5 (ref 5.0–8.0)

## 2022-02-16 LAB — CBC WITH DIFFERENTIAL/PLATELET
Abs Immature Granulocytes: 0.02 K/uL (ref 0.00–0.07)
Basophils Absolute: 0 K/uL (ref 0.0–0.1)
Basophils Relative: 1 %
Eosinophils Absolute: 0.1 K/uL (ref 0.0–0.5)
Eosinophils Relative: 1 %
HCT: 39.5 % (ref 36.0–46.0)
Hemoglobin: 13.4 g/dL (ref 12.0–15.0)
Immature Granulocytes: 0 %
Lymphocytes Relative: 29 %
Lymphs Abs: 2.3 K/uL (ref 0.7–4.0)
MCH: 28.2 pg (ref 26.0–34.0)
MCHC: 33.9 g/dL (ref 30.0–36.0)
MCV: 83.2 fL (ref 80.0–100.0)
Monocytes Absolute: 0.5 K/uL (ref 0.1–1.0)
Monocytes Relative: 6 %
Neutro Abs: 5 K/uL (ref 1.7–7.7)
Neutrophils Relative %: 63 %
Platelets: 268 K/uL (ref 150–400)
RBC: 4.75 MIL/uL (ref 3.87–5.11)
RDW: 13 % (ref 11.5–15.5)
WBC: 7.9 K/uL (ref 4.0–10.5)
nRBC: 0 % (ref 0.0–0.2)

## 2022-02-16 LAB — TYPE AND SCREEN
ABO/RH(D): O POS
Antibody Screen: NEGATIVE

## 2022-02-16 LAB — PROTIME-INR
INR: 1.1 (ref 0.8–1.2)
Prothrombin Time: 14.2 seconds (ref 11.4–15.2)

## 2022-02-16 NOTE — Progress Notes (Signed)
Thressa Sheller CNM in Triage to see pt and discuss test results and dc plan. Written and verbal d/c instructions given by CNM and pt then d/c home.

## 2022-02-16 NOTE — ED Triage Notes (Signed)
Pt states that she was at the [facility] yesterday for lower abd pain. She was told she was pregnant approximately 4-5 weeks. G3P2. Had US done there and diagnosed with ovarian cyst this morning and referred back to ED. Pt denies any N/V/D. No vaginal bleeding.

## 2022-02-16 NOTE — MAU Note (Signed)
.  Marisa Pearson is a 26 y.o. at Unknown here in MAU reporting lower abd and back pain. Pt had last Depo shot in April. In August started having some spotting off and on. Last spotting was early September. Has hx R ovarian cyst. Had u/s yesterday at [facility] and saw R ovarian cyst but did not see pregnancy. Was told to go to the ED. SHe has been in [facility] main ED all day and sent to MAU tonight. Continues to have lower abd pain and lower back pain that is bad cramping LMP: unknown Onset of complaint: weeks Pain score: 9 Vitals:   02/16/22 1945 02/16/22 2149  BP: 112/78 123/69  Pulse: 80 82  Resp: 16 17  Temp: 99.4 F (37.4 C) 98.2 F (36.8 C)  SpO2: 100% 100%     FHT:n/a Lab orders placed from triage:  u/a

## 2022-02-16 NOTE — ED Provider Triage Note (Addendum)
Emergency Medicine Provider Triage Evaluation Note  Marisa Pearson , a 26 y.o. female  was evaluated in triage.  Pt complains of being sent in by radiology for abnormal ultrasound last night. Reports recent positive pregnancy test, had ultrasound 1 week ago and was told to come in last night given abnormal test. Has had vaginal bleeding for the last few weeks, none in the last week. Initially had ultrasound for abdominal pain, but does not currently have any.  Review of Systems  Positive: Vaginal bleeding, abd pain Negative: Fever, chills   Physical Exam  BP 136/85 (BP Location: Right Arm)   Pulse 87   Temp 98.8 F (37.1 C)   Resp 16   SpO2 100%  Gen:   Awake, no distress   Resp:  Normal effort  MSK:   Moves extremities without difficulty   Medical Decision Making  Medically screening exam initiated at 1:10 PM.  Appropriate orders placed.  [name] was informed that the remainder of the evaluation will be completed by another provider, this initial triage assessment does not replace that evaluation, and the importance of remaining in the ED until their evaluation is complete.  Attempted to get a hold of radiology, no current recommendation.   LMP Unknown per pt.    Marisa Pearson, Marisa Pearson 02/16/22 1316  Beta hcg 4000+, spoke with heather MAU APP--she is agreeable to sending pt to MAU. Triage RN alerted.    Marisa Pearson, Marisa Pearson 02/16/22 2126

## 2022-02-16 NOTE — MAU Provider Note (Signed)
History     CSN: [id number]  Arrival date and time: 02/16/22 1140   None     Chief Complaint  Patient presents with   Ovarian Cyst   Abdominal Pain   Marisa Pearson is a 26 y.o. G3P2002 at Unknown who presents today with lower abdominal pain. She had hcg on 11/7 that was 160, then again yesterday 11/13 it was 3112. She also had an US done yesterday and no IUP was seen, but an ovarian cyst was found. She states that she was told to come in to be seen due to those findings. She is still having intermittent pain. She denies any vaginal bleeding.   Pelvic Pain The patient's primary symptoms include pelvic pain. The patient's pertinent negatives include no vaginal bleeding. This is a new problem. The current episode started in the past 7 days. The problem occurs intermittently. The problem has been unchanged. The pain is moderate. The problem affects the left side. She is pregnant. The vaginal discharge was normal. There has been no bleeding. Nothing aggravates the symptoms. She has tried acetaminophen for the symptoms. The treatment provided no relief.    OB History     Gravida  3   Para  2   Term  2   Preterm      AB      Living  2      SAB      IAB      Ectopic      Multiple  0   Live Births  2           Past Medical History:  Diagnosis Date   Medical history non-contributory     Past Surgical History:  Procedure Laterality Date   EXCISION OF BREAST BIOPSY Bilateral 11/28/2018   Procedure: EXCISION OF BILATERAL AXILLARY ACCESSORY BREAST TISSUE;  Surgeon: Donnie Mesa, MD;  Location: [place];  Service: General;  Laterality: Bilateral;    Family History  Problem Relation Age of Onset   Hypertension Maternal Grandmother    Hypertension Maternal Grandfather     Social History   Tobacco Use   Smoking status: Never   Smokeless tobacco: Never  Vaping Use   Vaping Use: Never used  Substance Use Topics   Alcohol use: No   Drug use: No     Allergies:  Allergies  Allergen Reactions   Peanut-Containing Drug Products Swelling    SWELLING REACTION UNSPECIFIED    Pork-Derived Products Other (See Comments)    Muslim, no pork    Facility-Administered Medications Prior to Admission  Medication Dose Route Frequency Provider Last Rate Last Admin   medroxyPROGESTERone (DEPO-PROVERA) injection 150 mg  150 mg Intramuscular Q90 days Anyanwu, Ugonna A, MD   150 mg at 07/30/21 0907   Medications Prior to Admission  Medication Sig Dispense Refill Last Dose   drospirenone-ethinyl estradiol (YAZ) 3-0.02 MG tablet Take 1 tablet by mouth daily. (Patient not taking: Reported on 07/30/2021) 28 tablet 11    medroxyPROGESTERone (DEPO-PROVERA) 150 MG/ML injection Inject 1 mL (150 mg total) into the muscle every 3 (three) months. 1 mL 4    nystatin cream (MYCOSTATIN) Apply 1 application topically 2 (two) times daily. (Patient not taking: Reported on 07/30/2021) 30 g 1    PHEXXI 1.8-1-0.4 % GEL ADMINISTER 1 APPLICATOR (5 G) INTRAVAGINALLY IMMEDIATELY BEFORE OR UP TO 1 HOUR BEFORE EACH ACT OF VAGINAL INTERCOURSE AS NEEDED. 60 g 11     Review of Systems  Genitourinary:  Positive  for pelvic pain.  All other systems reviewed and are negative.  Physical Exam   Blood pressure 123/69, pulse 82, temperature 98.2 F (36.8 C), resp. rate 17, height 5\' 2"  (1.575 m), weight 86.6 kg, SpO2 100 %, unknown if currently breastfeeding.  Physical Exam Constitutional:      Appearance: She is well-developed.  HENT:     Head: Normocephalic.  Eyes:     Pupils: Pupils are equal, round, and reactive to light.  Cardiovascular:     Rate and Rhythm: Regular rhythm.     Heart sounds: Normal heart sounds.  Pulmonary:     Effort: Pulmonary effort is normal. No respiratory distress.     Breath sounds: Normal breath sounds.  Abdominal:     Palpations: Abdomen is soft.     Tenderness: There is no abdominal tenderness.  Genitourinary:    Vagina: No bleeding.  Vaginal discharge: mucusy.    Comments: External: no lesion Vagina: small amount of white discharge     Musculoskeletal:        General: Normal range of motion.     Cervical back: Normal range of motion and neck supple.  Skin:    General: Skin is warm and dry.  Neurological:     Mental Status: She is alert and oriented to person, place, and time.  Psychiatric:        Mood and Affect: Mood normal.        Behavior: Behavior normal.     Results for orders placed or performed during the hospital encounter of 02/16/22 (from the past 24 hour(s))  CBC with Differential     Status: None   Collection Time: 02/16/22  1:05 PM  Result Value Ref Range   WBC 7.9 4.0 - 10.5 K/uL   RBC 4.75 3.87 - 5.11 MIL/uL   Hemoglobin 13.4 12.0 - 15.0 g/dL   HCT 39.5 36.0 - 46.0 %   MCV 83.2 80.0 - 100.0 fL   MCH 28.2 26.0 - 34.0 pg   MCHC 33.9 30.0 - 36.0 g/dL   RDW 13.0 11.5 - 15.5 %   Platelets 268 150 - 400 K/uL   nRBC 0.0 0.0 - 0.2 %   Neutrophils Relative % 63 %   Neutro Abs 5.0 1.7 - 7.7 K/uL   Lymphocytes Relative 29 %   Lymphs Abs 2.3 0.7 - 4.0 K/uL   Monocytes Relative 6 %   Monocytes Absolute 0.5 0.1 - 1.0 K/uL   Eosinophils Relative 1 %   Eosinophils Absolute 0.1 0.0 - 0.5 K/uL   Basophils Relative 1 %   Basophils Absolute 0.0 0.0 - 0.1 K/uL   Immature Granulocytes 0 %   Abs Immature Granulocytes 0.02 0.00 - 0.07 K/uL  Comprehensive metabolic panel     Status: Abnormal   Collection Time: 02/16/22  1:05 PM  Result Value Ref Range   Sodium 136 135 - 145 mmol/L   Potassium 4.1 3.5 - 5.1 mmol/L   Chloride 106 98 - 111 mmol/L   CO2 21 (L) 22 - 32 mmol/L   Glucose, Bld 76 70 - 99 mg/dL   BUN 7 6 - 20 mg/dL   Creatinine, Ser 0.61 0.44 - 1.00 mg/dL   Calcium 9.8 8.9 - 10.3 mg/dL   Total Protein 7.3 6.5 - 8.1 g/dL   Albumin 3.9 3.5 - 5.0 g/dL   AST 15 15 - 41 U/L   ALT 18 0 - 44 U/L   Alkaline Phosphatase 49 38 - 126 U/L  Total Bilirubin 0.4 0.3 - 1.2 mg/dL   GFR, Estimated >60  >60 mL/min   Anion gap 9 5 - 15  hCG, quantitative, pregnancy     Status: Abnormal   Collection Time: 02/16/22  1:05 PM  Result Value Ref Range   hCG, Beta Chain, Quant, S 4,163 (H) <5 mIU/mL  Type and screen [place]     Status: None   Collection Time: 02/16/22  1:47 PM  Result Value Ref Range   ABO/RH(D) O POS    Antibody Screen NEG    Sample Expiration      02/19/2022,2359 Performed at [facility], [place] [address] [place], [place] [address]   Protime-INR     Status: None   Collection Time: 02/16/22 10:12 PM  Result Value Ref Range   Prothrombin Time 14.2 11.4 - 15.2 seconds   INR 1.1 0.8 - 1.2  Urinalysis, Routine w reflex microscopic     Status: Abnormal   Collection Time: 02/16/22 10:16 PM  Result Value Ref Range   Color, Urine YELLOW YELLOW   APPearance CLOUDY (A) CLEAR   Specific Gravity, Urine 1.016 1.005 - 1.030   pH 5.0 5.0 - 8.0   Glucose, UA NEGATIVE NEGATIVE mg/dL   Hgb urine dipstick NEGATIVE NEGATIVE   Bilirubin Urine NEGATIVE NEGATIVE   Ketones, ur NEGATIVE NEGATIVE mg/dL   Protein, ur NEGATIVE NEGATIVE mg/dL   Nitrite NEGATIVE NEGATIVE   Leukocytes,Ua MODERATE (A) NEGATIVE   RBC / HPF 0-5 0 - 5 RBC/hpf   WBC, UA 6-10 0 - 5 WBC/hpf   Bacteria, UA MANY (A) NONE SEEN   Squamous Epithelial / LPF 21-50 0 - 5   Mucus PRESENT    US OB Transvaginal  Result Date: 02/16/2022 CLINICAL DATA:  Vaginal bleeding EXAM: TRANSVAGINAL OB ULTRASOUND TECHNIQUE: Transvaginal ultrasound was performed for complete evaluation of the gestation as well as the maternal uterus, adnexal regions, and pelvic cul-de-sac. COMPARISON:  02/15/2022 FINDINGS: Intrauterine gestational sac: Probable intrauterine gestational sac. Yolk sac:  Not visualized Embryo:  Not visualized MSD: 6.8 mm mm   5 w   3 d Subchorionic hemorrhage:  Small subchorionic hemorrhage. Maternal uterus/adnexae: Right ovary measures 2.3 by 2.5 x 2 cm. Left ovary measures 8.7 x 7.9 by 7.2 cm. Large  anechoic cyst measuring 6.7 x 6.4 x 6.7 cm. This was previously identified as right ovary (left ovary was not seen on the prior) but on today's exam, the large cyst localizes to the left ovary with a normal right ovary visualized. IMPRESSION: 1. Probable early intrauterine gestational sac, but no yolk sac, fetal pole, or cardiac activity yet visualized. Recommend follow-up quantitative B-HCG levels and follow-up US in 14 days to confirm and assess viability. This recommendation follows SRU consensus guidelines: Diagnostic Criteria for Nonviable Pregnancy Early in the First Trimester. [place] Med 2013WM:705707. 2. Small subchorionic hemorrhage 3. Large simple appearing left ovarian cyst measuring 6.7 cm. Consider sonographic follow-up in 3-6 months. Electronically Signed   By: Donavan Foil M.D.   On: 02/16/2022 23:00     MAU Course  Procedures  MDM Reviewed [place] results with patient. IUP seen today, but no fetal pole at this time. Will get FU [place] in 2 weeks to confirm viability. Orders placed.   Assessment and Plan   1. Intrauterine pregnancy   2. [redacted] weeks gestation of pregnancy   3. Type O blood, Rh positive   4. Pelvic pain   5. Cyst of  left ovary    DC home in stable condition  1st Trimester precautions  Bleeding precautions Ectopic precautions RX: order for outpatient viability scan placed  Return to MAU as needed FU with OB as planned   Follow-up Information     [facility] Follow up in 2 week(s).   Specialty: Radiology Why: They will call you with an appointment Contact information: [address] [place] [address] [phone]               Thressa Sheller DNP, CNM  02/16/22  11:46 PM

## 2022-03-02 ENCOUNTER — Ambulatory Visit
Admission: RE | Admit: 2022-03-02 | Discharge: 2022-03-02 | Disposition: A | Discharge status: Home / Self Care | Payer: Medicaid Other | Source: Ambulatory Visit | Attending: Advanced Practice Midwife | Admitting: Advanced Practice Midwife

## 2022-03-02 DIAGNOSIS — Z3689 Encounter for other specified antenatal screening: Secondary | ICD-10-CM | POA: Diagnosis not present

## 2022-03-02 DIAGNOSIS — Z6740 Type O blood, Rh positive: Secondary | ICD-10-CM | POA: Insufficient documentation

## 2022-03-02 DIAGNOSIS — Z3490 Encounter for supervision of normal pregnancy, unspecified, unspecified trimester: Secondary | ICD-10-CM | POA: Insufficient documentation

## 2022-03-02 DIAGNOSIS — R102 Pelvic and perineal pain: Secondary | ICD-10-CM | POA: Insufficient documentation

## 2022-03-02 DIAGNOSIS — Z3A01 Less than 8 weeks gestation of pregnancy: Secondary | ICD-10-CM | POA: Diagnosis not present

## 2022-03-03 ENCOUNTER — Encounter: Payer: Self-pay | Admitting: Medical

## 2022-03-03 DIAGNOSIS — N83209 Unspecified ovarian cyst, unspecified side: Secondary | ICD-10-CM | POA: Insufficient documentation

## 2022-03-03 DIAGNOSIS — O3481 Maternal care for other abnormalities of pelvic organs, first trimester: Secondary | ICD-10-CM | POA: Insufficient documentation

## 2022-03-03 DIAGNOSIS — Z3480 Encounter for supervision of other normal pregnancy, unspecified trimester: Secondary | ICD-10-CM | POA: Insufficient documentation

## 2022-03-16 ENCOUNTER — Encounter: Payer: Self-pay | Admitting: Obstetrics & Gynecology

## 2022-04-02 ENCOUNTER — Encounter: Payer: Self-pay | Admitting: Obstetrics & Gynecology

## 2022-04-02 ENCOUNTER — Other Ambulatory Visit (HOSPITAL_COMMUNITY)
Admission: RE | Admit: 2022-04-02 | Discharge: 2022-04-02 | Disposition: A | Discharge status: Home / Self Care | Payer: Medicaid Other | Source: Ambulatory Visit | Attending: Obstetrics & Gynecology | Admitting: Obstetrics & Gynecology

## 2022-04-02 ENCOUNTER — Ambulatory Visit (INDEPENDENT_AMBULATORY_CARE_PROVIDER_SITE_OTHER): Payer: Medicaid Other | Admitting: Obstetrics & Gynecology

## 2022-04-02 VITALS — BP 116/76 | HR 81 | Wt 183.0 lb

## 2022-04-02 DIAGNOSIS — O99211 Obesity complicating pregnancy, first trimester: Secondary | ICD-10-CM

## 2022-04-02 DIAGNOSIS — O26891 Other specified pregnancy related conditions, first trimester: Secondary | ICD-10-CM | POA: Insufficient documentation

## 2022-04-02 DIAGNOSIS — N898 Other specified noninflammatory disorders of vagina: Secondary | ICD-10-CM | POA: Diagnosis not present

## 2022-04-02 DIAGNOSIS — O3481 Maternal care for other abnormalities of pelvic organs, first trimester: Secondary | ICD-10-CM

## 2022-04-02 DIAGNOSIS — Z3481 Encounter for supervision of other normal pregnancy, first trimester: Secondary | ICD-10-CM | POA: Diagnosis not present

## 2022-04-02 DIAGNOSIS — N83209 Unspecified ovarian cyst, unspecified side: Secondary | ICD-10-CM

## 2022-04-02 DIAGNOSIS — Z3480 Encounter for supervision of other normal pregnancy, unspecified trimester: Secondary | ICD-10-CM | POA: Diagnosis not present

## 2022-04-02 DIAGNOSIS — O99210 Obesity complicating pregnancy, unspecified trimester: Secondary | ICD-10-CM

## 2022-04-02 DIAGNOSIS — Z3A11 11 weeks gestation of pregnancy: Secondary | ICD-10-CM | POA: Diagnosis not present

## 2022-04-02 MED ORDER — ASPIRIN 81 MG PO TBEC: 81.0000 mg | DELAYED_RELEASE_TABLET | Freq: Every day | ORAL | 2 refills | Status: DC

## 2022-04-02 NOTE — Progress Notes (Signed)
History:   Marisa Pearson is a 26 y.o. G3P2002 at [redacted]w[redacted]d by early ultrasound being seen today for her first obstetrical visit.  Her obstetrical history is significant for  two term vaginal deliveries . Patient does intend to breast feed. Pregnancy history fully reviewed.  Patient reports  some lower abdominal pain .  She was diagnosed with a 6 cm left ovarian simple cyst which causes her some pain. She is worried about this cyst, and wants to know if she needs intervention for it.  She is not taking any pain medication. Pain is intermittent.  She is also worried about having vaginal discharge with odor, and has noticed tinge of blood periodically. No bright red blood/overt bleeding.     Patient also desires letter to send to embassy so her mother can come from [place] to help her during pregnancy and in her postpartum period.     HISTORY: OB History  Gravida Para Term Preterm AB Living  3 2 2  0 0 2  SAB IAB Ectopic Multiple Live Births  0 0 0 0 2    # Outcome Date GA Lbr Len/2nd Weight Sex Delivery Anes PTL Lv  3 Current           2 Term 05/19/19 [redacted]w[redacted]d 03:34 / 00:05 6 lb 8.5 oz (2.963 kg) M Vag-Spont EPI  LIV     Name: ANTONITA, MCKENDRY Rejeana     Apgar1: 8  Apgar5: 9  1 Term 02/04/17 [redacted]w[redacted]d 17:04 / 00:32 6 lb 8.9 oz (2.974 kg) M Vag-Spont EPI  LIV     Name: ABDOUL AZIZ GARBA,BOYLEYLA     Apgar1: 9  Apgar5: 9    Last pap smear was done 10/14/20 and was normal  Past Medical History:  Diagnosis Date   Medical history non-contributory    Past Surgical History:  Procedure Laterality Date   EXCISION OF BREAST BIOPSY Bilateral 11/28/2018   Procedure: EXCISION OF BILATERAL AXILLARY ACCESSORY BREAST TISSUE;  Surgeon: Donnie Mesa, MD;  Location: [place];  Service: General;  Laterality: Bilateral;   Family History  Problem Relation Age of Onset   Hypertension Maternal Grandmother    Hypertension Maternal Grandfather    Social History   Tobacco Use   Smoking status: Never    Smokeless tobacco: Never  Vaping Use   Vaping Use: Never used  Substance Use Topics   Alcohol use: No   Drug use: No   Allergies  Allergen Reactions   Peanut-Containing Drug Products Swelling    SWELLING REACTION UNSPECIFIED    Pork-Derived Products Other (See Comments)    Muslim, no pork   Current Outpatient Medications on File Prior to Visit  Medication Sig Dispense Refill   nystatin cream (MYCOSTATIN) Apply 1 application topically 2 (two) times daily. (Patient not taking: Reported on 07/30/2021) 30 g 1   No current facility-administered medications on file prior to visit.    Review of Systems Pertinent items noted in HPI and remainder of comprehensive ROS otherwise negative.  Indications for ASA therapy Two or more of the following: Obesity (body mass index >30 kg/m2) Yes Sociodemographic characteristics (African American race, low socioeconomic level) Yes  Indications for early GDM screening (FBS, A1C, Random CBG, GTT) BMI >30kg/m2 Yes High-risk race/ethnicity (eg, African American, Latino, Native American, Asian American, Pacific Islander) Yes  Physical Exam:   Vitals:   04/02/22 1135  BP: 116/76  Pulse: 81  Weight: 183 lb (83 kg)   Fetal Heart Rate (bpm): 154  General: well-developed, well-nourished female in no acute distress  Breasts:  deferred by patient,no issues reported by patient  Skin: normal coloration and turgor, no rashes  Neurologic: oriented, normal, negative, normal mood  Extremities: normal strength, tone, and muscle mass, ROM of all joints is normal  HEENT PERRLA, extraocular movement intact and sclera clear, anicteric  Neck supple and no masses  Cardiovascular: regular rate and rhythm  Respiratory:  no respiratory distress, normal breath sounds  Abdomen: soft, non-tender; bowel sounds normal; no masses,  no organomegaly  Pelvic: normal external genitalia, no lesions, normal vaginal mucosa, white vaginal discharge, testing sample obtained.  Exam done in the presence of a chaperone.    Imaging: US OB Transvaginal  Result Date: 03/02/2022 CLINICAL DATA:  Pregnant, assess viability.  Unknown LMP. EXAM: TRANSVAGINAL OB ULTRASOUND TECHNIQUE: Transvaginal ultrasound was performed for complete evaluation of the gestation as well as the maternal uterus, adnexal regions, and pelvic cul-de-sac. COMPARISON:  None Available. FINDINGS: Intrauterine gestational sac: Present, single Yolk sac:  Visualized. Embryo:  Visualized. Cardiac Activity: Visualized. Heart Rate: 135 bpm MSD: Appropriate given fetal size CRL:   9 mm   6 w 6 d                  [place] EDC: 10/20/2022 Subchorionic hemorrhage:  None visualized. Maternal uterus/adnexae: 6.1 x 6.8 x 6.9 cm simple cyst again identified within the left ovary, stable since prior examination when accounting for measurement error. The right ovary is unremarkable. The uterus is anteverted. Cervix is unremarkable. No intrauterine masses are seen. No free fluid seen within the cul-de-sac. IMPRESSION: Single living intrauterine gestation with an estimated gestational age of [redacted] weeks, 6 days. No acute abnormality. 6.9 cm left ovarian simple cyst. Recommend follow-up US in 3-6 months. Note: This recommendation does not apply to premenarchal patients or to those with increased risk (genetic, family history, elevated tumor markers or other high-risk factors) of ovarian cancer. Reference: Radiology 2019 Nov; 293(2):359-371. Electronically Signed   By: Fidela Salisbury M.D.   On: 03/02/2022 15:01   US OB Transvaginal  Result Date: 02/16/2022 CLINICAL DATA:  Vaginal bleeding EXAM: TRANSVAGINAL OB ULTRASOUND TECHNIQUE: Transvaginal ultrasound was performed for complete evaluation of the gestation as well as the maternal uterus, adnexal regions, and pelvic cul-de-sac. COMPARISON:  02/15/2022 FINDINGS: Intrauterine gestational sac: Probable intrauterine gestational sac. Yolk sac:  Not visualized Embryo:  Not visualized MSD: 6.8 mm mm    5 w   3 d Subchorionic hemorrhage:  Small subchorionic hemorrhage. Maternal uterus/adnexae: Right ovary measures 2.3 by 2.5 x 2 cm. Left ovary measures 8.7 x 7.9 by 7.2 cm. Large anechoic cyst measuring 6.7 x 6.4 x 6.7 cm. This was previously identified as right ovary (left ovary was not seen on the prior) but on today's exam, the large cyst localizes to the left ovary with a normal right ovary visualized. IMPRESSION: 1. Probable early intrauterine gestational sac, but no yolk sac, fetal pole, or cardiac activity yet visualized. Recommend follow-up quantitative B-HCG levels and follow-up US in 14 days to confirm and assess viability. This recommendation follows SRU consensus guidelines: Diagnostic Criteria for Nonviable Pregnancy Early in the First Trimester. [place] Med 2013WM:705707. 2. Small subchorionic hemorrhage 3. Large simple appearing left ovarian cyst measuring 6.7 cm. Consider sonographic follow-up in 3-6 months. Electronically Signed   By: Donavan Foil M.D.   On: 02/16/2022 23:00   US OB LESS THAN 14 WEEKS W/ OB TRANSVAGINAL AND DOPPLER  Result Date:  02/15/2022 CLINICAL DATA:  Bilateral lower abdominal pain, quantitative beta HCG 160 on 02/09/2022 EXAM: OBSTETRIC <14 WK [place] AND TRANSVAGINAL OB US DOPPLER ULTRASOUND OF OVARIES TECHNIQUE: Both transabdominal and transvaginal ultrasound examinations were performed for complete evaluation of the gestation as well as the maternal uterus, adnexal regions, and pelvic cul-de-sac. Transvaginal technique was performed to assess early pregnancy. Color and duplex Doppler ultrasound was utilized to evaluate blood flow to the ovaries. COMPARISON:  None Available. FINDINGS: Intrauterine gestational sac: None Yolk sac:  Not Visualized. Embryo:  Not Visualized. Maternal uterus/adnexae: Uterus is anteverted without evidence of underlying mass. Endometrium measures 5 mm, with no irregularity or abnormality. The right ovary measures 7.9 by 9.3 x 6.0 cm, with a  simple appearing 8.1 x 5.6 x 7.6 cm right ovarian cyst identified. Adjacent to this large cyst, but separate from the normal ovarian tissue, there is an indeterminate 2.0 x 1.3 x 1.7 cm hypoechoic structure. There is no evidence of yolk sac or fetal pole to suggest ectopic pregnancy, though close follow-up is warranted given the lack of intrauterine pregnancy and positive beta HCG. The left ovary is not well seen. There is a small amount of simple appearing free fluid within the cul-de-sac. Pulsed Doppler evaluation of the right ovary demonstrates normal appearing low-resistance arterial and venous waveforms. The left ovary is not identified. IMPRESSION: 1. No evidence of intrauterine pregnancy on this exam. 2. Indeterminate 2.0 x 1.3 x 1.7 cm hypoechoic structure within the right ovary. While this could reflect a corpus luteum cyst, ectopic pregnancy cannot be excluded given the positive beta HCG and lack of documented intrauterine pregnancy. Serial beta HCG measurements and follow-up ultrasound are recommended to document live intrauterine pregnancy. 3. Simple appearing 8.1 x 5.6 x 7.6 cm right ovarian cyst. Given the size, short interval follow-up ultrasound in 3-6 months could be performed. Critical Value/emergent results were called by telephone at the time of interpretation on 02/15/2022 at 5:18 pm to provider [facility], who verbally acknowledged these results. Electronically Signed   By: Sharlet Salina M.D.   On: 02/15/2022 17:33     Assessment:    Pregnancy: [id number] Patient Active Problem List   Diagnosis Date Noted   Ovarian cyst affecting pregnancy in first trimester, antepartum 03/03/2022   Supervision of other normal pregnancy, antepartum 03/03/2022     Plan:    1. Ovarian cyst affecting pregnancy in first trimester, antepartum Reassured about ovarian cyst, likely corpus luteal cyst.  She was told no intervention is needed for this cyst as it helps produce hormones to support the  pregnancy at this point, and there is no sign of torsion. It will resolve on its own. Patient was reassured. Advised to take Tylenol as needed for pain.  2. Obesity in pregnancy, antepartum TWG 11-20 lbs recommended. Surveillance labs done today. ASA prescribed for preeclampsia prophylaxis. - Comprehensive metabolic panel - Hemoglobin A1c - aspirin EC 81 MG tablet; Take 1 tablet (81 mg total) by mouth daily. Start taking when you are [redacted] weeks pregnant for rest of pregnancy for prevention of preeclampsia  Dispense: 300 tablet; Refill: 2 - [place] MFM OB DETAIL +14 WK; Future  3. Vaginal discharge during pregnancy in first trimester - Cervicovaginal ancillary only( [place]) done today, will follow up results and manage accordingly.  4. [redacted] weeks gestation of pregnancy 5. Supervision of other normal pregnancy, antepartum - CBC/D/Plt+RPR+Rh+ABO+RubIgG... - Culture, OB Urine - PANORAMA PRENATAL TEST FULL PANEL - HORIZON CUSTOM - [place] MFM OB DETAIL +14  WK; Future  Initial labs drawn. Continue prenatal vitamins. Problem list reviewed and updated. Embassy letter for patient's mother provided by her request. Genetic Screening discussed, Panorama and Horizon: ordered. 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 Will follow up results and manage accordingly. Ultrasound discussed; fetal anatomic survey: scheduled. Anticipatory guidance about prenatal visits given including labs, ultrasounds, and testing. Discussed usage of the Babyscripts app for more information about pregnancy, and to track blood pressures. Also discussed usage of virtual visits as additional source of managing and completing prenatal visits.  Patient was encouraged to use MyChart to review results, send requests, and have questions addressed.   The nature of Center for [facility]/Faculty Practice with multiple MDs and Advanced Practice Providers was  explained to patient; also emphasized that residents, students are part of our team. Routine obstetric precautions reviewed. Encouraged to seek out care at our office or emergency room [facility] preferred) for urgent and/or emergent concerns. Return in about 4 weeks (around 04/30/2022) for OFFICE OB VISIT (MD or APP).     Verita Schneiders, MD, [place] for [employer], [place]

## 2022-04-03 LAB — HCV INTERPRETATION

## 2022-04-03 LAB — COMPREHENSIVE METABOLIC PANEL
ALT: 28 IU/L (ref 0–32)
AST: 22 IU/L (ref 0–40)
Albumin/Globulin Ratio: 1.5 (ref 1.2–2.2)
Albumin: 4.1 g/dL (ref 4.0–5.0)
Alkaline Phosphatase: 52 IU/L (ref 44–121)
BUN/Creatinine Ratio: 16 (ref 9–23)
BUN: 7 mg/dL (ref 6–20)
Bilirubin Total: 0.2 mg/dL (ref 0.0–1.2)
CO2: 20 mmol/L (ref 20–29)
Calcium: 10 mg/dL (ref 8.7–10.2)
Chloride: 101 mmol/L (ref 96–106)
Creatinine, Ser: 0.45 mg/dL — ABNORMAL LOW (ref 0.57–1.00)
Globulin, Total: 2.8 g/dL (ref 1.5–4.5)
Glucose: 71 mg/dL (ref 70–99)
Potassium: 4.2 mmol/L (ref 3.5–5.2)
Sodium: 134 mmol/L (ref 134–144)
Total Protein: 6.9 g/dL (ref 6.0–8.5)
eGFR: 136 mL/min/{1.73_m2} (ref >59)

## 2022-04-03 LAB — CBC/D/PLT+RPR+RH+ABO+RUBIGG...
Antibody Screen: NEGATIVE
Basophils Absolute: 0 10*3/uL (ref 0.0–0.2)
Basos: 0 %
EOS (ABSOLUTE): 0.2 10*3/uL (ref 0.0–0.4)
Eos: 2 %
HCV Ab: NONREACTIVE
HIV Screen 4th Generation wRfx: NONREACTIVE
Hematocrit: 38.9 % (ref 34.0–46.6)
Hemoglobin: 12.7 g/dL (ref 11.1–15.9)
Hepatitis B Surface Ag: NEGATIVE
Immature Grans (Abs): 0.1 10*3/uL (ref 0.0–0.1)
Immature Granulocytes: 1 %
Lymphocytes Absolute: 1.9 10*3/uL (ref 0.7–3.1)
Lymphs: 24 %
MCH: 27.9 pg (ref 26.6–33.0)
MCHC: 32.6 g/dL (ref 31.5–35.7)
MCV: 85 fL (ref 79–97)
Monocytes Absolute: 0.5 10*3/uL (ref 0.1–0.9)
Monocytes: 7 %
Neutrophils Absolute: 5.2 10*3/uL (ref 1.4–7.0)
Neutrophils: 66 %
Platelets: 251 10*3/uL (ref 150–450)
RBC: 4.56 x10E6/uL (ref 3.77–5.28)
RDW: 13.4 % (ref 11.7–15.4)
RPR Ser Ql: NONREACTIVE
Rh Factor: POSITIVE
Rubella Antibodies, IGG: 15.3 index (ref >0.99)
WBC: 7.9 10*3/uL (ref 3.4–10.8)

## 2022-04-03 LAB — HEMOGLOBIN A1C
Est. average glucose Bld gHb Est-mCnc: 100 mg/dL
Hgb A1c MFr Bld: 5.1 % (ref 4.8–5.6)

## 2022-04-04 LAB — CULTURE, OB URINE

## 2022-04-04 LAB — URINE CULTURE, OB REFLEX

## 2022-04-05 NOTE — L&D Delivery Note (Addendum)
OB/GYN Faculty Practice Delivery Note  Marisa Pearson is a 27 y.o. [id number] s/p VD at [redacted]w[redacted]d. She was admitted for eIOL.   ROM: 4h 63m with clear fluid GBS Status:  Negative/-- (06/21 1111) Maximum Maternal Temperature: 98.34F  Labor Progress: Initial SVE: 1/thick. She then progressed to complete.   Delivery Date/Time: 10/19/2022 1053 Delivery: Called to room and patient was complete and pushing. Head delivered LOA. No nuchal cord present. Shoulder and body delivered in usual fashion. Infant with spontaneous cry, placed on mother's abdomen, dried and stimulated. Cord clamped x 2 after 1-minute delay, and cut by mother. Cord blood drawn. Placenta delivered spontaneously with gentle cord traction. Fundus firm with massage and Pitocin. Labia, perineum, vagina, and cervix inspected with no lacerations.  Baby Weight: pending  Placenta: 3 vessel, intact. Sent to L&D Complications: None Lacerations: as above EBL: 235 mL Analgesia: Epidural   Infant:  APGAR (1 MIN):  8 APGAR (5 MINS):  9  Para March, DO Family Medicine Resident Center for [employer], [facility] 10/19/2022, 11:13 AM  ___ GME ATTESTATION:  Evaluation and management procedures were performed by the [facility] Medicine Resident under my supervision. I was immediately available for direct supervision, assistance and direction throughout this encounter.  I also confirm that I have verified the information documented in the resident's note, and that I have also personally reperformed the pertinent components of the physical exam and all of the medical decision making activities.  I have also made any necessary editorial changes.  Myrtie Hawk, DO OB Fellow, Faculty [facility], Center for [facility] 10/19/2022 3:42 PM

## 2022-04-06 LAB — CERVICOVAGINAL ANCILLARY ONLY
Bacterial Vaginitis (gardnerella): NEGATIVE
Candida Glabrata: NEGATIVE
Candida Vaginitis: NEGATIVE
Chlamydia: NEGATIVE
Comment: NEGATIVE
Comment: NEGATIVE
Comment: NEGATIVE
Comment: NEGATIVE
Comment: NEGATIVE
Comment: NORMAL
Neisseria Gonorrhea: NEGATIVE
Trichomonas: NEGATIVE

## 2022-04-07 LAB — PANORAMA PRENATAL TEST FULL PANEL:PANORAMA TEST PLUS 5 ADDITIONAL MICRODELETIONS: FETAL FRACTION: 4.8

## 2022-04-13 ENCOUNTER — Encounter: Payer: Self-pay | Admitting: Obstetrics and Gynecology

## 2022-04-13 DIAGNOSIS — D573 Sickle-cell trait: Secondary | ICD-10-CM

## 2022-04-13 HISTORY — DX: Sickle-cell trait: D57.3

## 2022-04-13 LAB — HORIZON CUSTOM: REPORT SUMMARY: POSITIVE — AB

## 2022-04-30 ENCOUNTER — Encounter: Payer: Self-pay | Admitting: Family Medicine

## 2022-04-30 ENCOUNTER — Ambulatory Visit (INDEPENDENT_AMBULATORY_CARE_PROVIDER_SITE_OTHER): Payer: Medicaid Other | Admitting: Family Medicine

## 2022-04-30 VITALS — BP 112/70 | HR 78 | Wt 179.0 lb

## 2022-04-30 DIAGNOSIS — O3481 Maternal care for other abnormalities of pelvic organs, first trimester: Secondary | ICD-10-CM

## 2022-04-30 DIAGNOSIS — Z3480 Encounter for supervision of other normal pregnancy, unspecified trimester: Secondary | ICD-10-CM

## 2022-04-30 DIAGNOSIS — D573 Sickle-cell trait: Secondary | ICD-10-CM

## 2022-04-30 DIAGNOSIS — Z3A15 15 weeks gestation of pregnancy: Secondary | ICD-10-CM

## 2022-04-30 DIAGNOSIS — O3482 Maternal care for other abnormalities of pelvic organs, second trimester: Secondary | ICD-10-CM

## 2022-04-30 NOTE — Progress Notes (Unsigned)
   PRENATAL VISIT NOTE  Subjective:  Marisa Pearson is a 27 y.o. G3P2002 at [redacted]w[redacted]d being seen today for ongoing prenatal care.  She is currently monitored for the following issues for this low-risk pregnancy and has Ovarian cyst affecting pregnancy in first trimester, antepartum; Supervision of other normal pregnancy, antepartum; and Sickle cell trait ([place]) on their problem list.  Patient reports no complaints.  Contractions: Not present. Vag. Bleeding: None.   . Denies leaking of fluid.   The following portions of the patient's history were reviewed and updated as appropriate: allergies, current medications, past family history, past medical history, past social history, past surgical history and problem list.   Objective:   Vitals:   04/30/22 1109  BP: 112/70  Pulse: 78  Weight: 179 lb (81.2 kg)    Fetal Status: Fetal Heart Rate (bpm): 150         General:  Alert, oriented and cooperative. Patient is in no acute distress.  Skin: Skin is warm and dry. No rash noted.   Cardiovascular: Normal heart rate noted  Respiratory: Normal respiratory effort, no problems with respiration noted  Abdomen: Soft, gravid, appropriate for gestational age.  Pain/Pressure: Absent     Pelvic: Cervical exam deferred        Extremities: Normal range of motion.     Mental Status: Normal mood and affect. Normal behavior. Normal judgment and thought content.   Assessment and Plan:  Pregnancy: G3P2002 at [redacted]w[redacted]d 1. Ovarian cyst affecting pregnancy in first trimester, antepartum Monitor  2. Sickle cell trait ([place]) Lengthy discussion about [place] trait as her partner is a known carrier as well. She is worried about having a child with SS disease. Discussed that with every pregnancy she has 1:4 chance and that all infants are tested at birth through required state screening. She told me about her Aunt who has a daughter with [place] Disease.   3. Supervision of other normal pregnancy, antepartum Up to  date Concerns about [place] trait per above Interested in Copper IUD  Preterm labor symptoms and general obstetric precautions including but not limited to vaginal bleeding, contractions, leaking of fluid and fetal movement were reviewed in detail with the patient. Please refer to After Visit Summary for other counseling recommendations.   Return in about 4 weeks (around 05/28/2022) for Routine prenatal care.  Future Appointments  Date Time Provider [place]  05/26/2022  9:30 AM [facility] NURSE [facility]  05/26/2022  9:45 AM [facility] US4 [facility]  05/28/2022 10:35 AM Aletha Halim, MD [facility]  06/17/2022  9:20 AM Eugenia Pancoast, FNP [facility]  06/25/2022 10:35 AM Caren Macadam, MD [facility]    Caren Macadam, MD

## 2022-05-26 ENCOUNTER — Ambulatory Visit: Payer: Medicaid Other | Attending: Obstetrics & Gynecology

## 2022-05-26 ENCOUNTER — Ambulatory Visit: Payer: Medicaid Other | Admitting: *Deleted

## 2022-05-26 ENCOUNTER — Encounter: Payer: Self-pay | Admitting: *Deleted

## 2022-05-26 ENCOUNTER — Other Ambulatory Visit: Payer: Self-pay | Admitting: Obstetrics and Gynecology

## 2022-05-26 VITALS — BP 107/60 | HR 75

## 2022-05-26 DIAGNOSIS — Z363 Encounter for antenatal screening for malformations: Secondary | ICD-10-CM | POA: Diagnosis not present

## 2022-05-26 DIAGNOSIS — Z3A19 19 weeks gestation of pregnancy: Secondary | ICD-10-CM | POA: Diagnosis not present

## 2022-05-26 DIAGNOSIS — O99212 Obesity complicating pregnancy, second trimester: Secondary | ICD-10-CM

## 2022-05-26 DIAGNOSIS — E669 Obesity, unspecified: Secondary | ICD-10-CM | POA: Diagnosis not present

## 2022-05-26 DIAGNOSIS — Z3A11 11 weeks gestation of pregnancy: Secondary | ICD-10-CM | POA: Diagnosis not present

## 2022-05-26 DIAGNOSIS — O99012 Anemia complicating pregnancy, second trimester: Secondary | ICD-10-CM | POA: Insufficient documentation

## 2022-05-26 DIAGNOSIS — O99210 Obesity complicating pregnancy, unspecified trimester: Secondary | ICD-10-CM | POA: Insufficient documentation

## 2022-05-26 DIAGNOSIS — O3482 Maternal care for other abnormalities of pelvic organs, second trimester: Secondary | ICD-10-CM | POA: Insufficient documentation

## 2022-05-26 DIAGNOSIS — Z3689 Encounter for other specified antenatal screening: Secondary | ICD-10-CM | POA: Insufficient documentation

## 2022-05-26 DIAGNOSIS — Z3480 Encounter for supervision of other normal pregnancy, unspecified trimester: Secondary | ICD-10-CM | POA: Diagnosis present

## 2022-05-28 ENCOUNTER — Ambulatory Visit (INDEPENDENT_AMBULATORY_CARE_PROVIDER_SITE_OTHER): Payer: Medicaid Other | Admitting: Obstetrics and Gynecology

## 2022-05-28 VITALS — BP 112/69 | HR 82 | Wt 177.0 lb

## 2022-05-28 DIAGNOSIS — D573 Sickle-cell trait: Secondary | ICD-10-CM

## 2022-05-28 DIAGNOSIS — Z3A19 19 weeks gestation of pregnancy: Secondary | ICD-10-CM | POA: Diagnosis not present

## 2022-05-28 NOTE — Progress Notes (Signed)
ROB   Pt has U/S on 05/26/22  CC: None

## 2022-05-28 NOTE — Progress Notes (Signed)
   PRENATAL VISIT NOTE  Subjective:  Marisa Pearson is a 27 y.o. G3P2002 at 44w2dbeing seen today for ongoing prenatal care.  She is currently monitored for the following issues for this low-risk pregnancy and has Supervision of other normal pregnancy, antepartum and Sickle cell trait ([place] on their problem list.  Patient reports no complaints.  Contractions: Not present. Vag. Bleeding: None.  Movement: Present. Denies leaking of fluid.   The following portions of the patient's history were reviewed and updated as appropriate: allergies, current medications, past family history, past medical history, past social history, past surgical history and problem list.   Objective:   Vitals:   05/28/22 1103  BP: 112/69  Pulse: 82  Weight: 177 lb (80.3 kg)    Fetal Status: Fetal Heart Rate (bpm): 152   Movement: Present     General:  Alert, oriented and cooperative. Patient is in no acute distress.  Skin: Skin is warm and dry. No rash noted.   Cardiovascular: Normal heart rate noted  Respiratory: Normal respiratory effort, no problems with respiration noted  Abdomen: Soft, gravid, appropriate for gestational age.  Pain/Pressure: Absent     Pelvic: Cervical exam deferred        Extremities: Normal range of motion.  Edema: None  Mental Status: Normal mood and affect. Normal behavior. Normal judgment and thought content.   Assessment and Plan:  Pregnancy: G3P2002 at [redacted]w[redacted]d. Sickle cell trait (HCC) Qtrimester ucx today. [facility]. Pt considering testing - Culture, OB Urine  2. [redacted] weeks gestation of pregnancy Normal antomy u/s - Culture, OB Urine  Preterm labor symptoms and general obstetric precautions including but not limited to vaginal bleeding, contractions, leaking of fluid and fetal movement were reviewed in detail with the patient. Please refer to After Visit Summary for other counseling recommendations.   Return in about 1 month (around 06/26/2022) for  in person, low risk ob, md or app.  Future Appointments  Date Time Provider [place]3/14/2024  9:20 AM DuEugenia PancoastFNP [facility]  06/25/2022 10:35 AM NeCaren MacadamMD [facility]  07/21/2022  8:30 AM [facility]  8:45 AM [facility] US4 [facility]    ChAletha HalimMD

## 2022-05-30 LAB — CULTURE, OB URINE

## 2022-05-30 LAB — URINE CULTURE, OB REFLEX

## 2022-06-17 ENCOUNTER — Ambulatory Visit: Payer: Medicaid Other | Admitting: Family

## 2022-06-25 ENCOUNTER — Ambulatory Visit (INDEPENDENT_AMBULATORY_CARE_PROVIDER_SITE_OTHER): Payer: Medicaid Other | Admitting: Family Medicine

## 2022-06-25 VITALS — BP 115/73 | HR 84 | Wt 181.0 lb

## 2022-06-25 DIAGNOSIS — Z3A23 23 weeks gestation of pregnancy: Secondary | ICD-10-CM

## 2022-06-25 DIAGNOSIS — Z3480 Encounter for supervision of other normal pregnancy, unspecified trimester: Secondary | ICD-10-CM

## 2022-06-25 DIAGNOSIS — Z3482 Encounter for supervision of other normal pregnancy, second trimester: Secondary | ICD-10-CM

## 2022-06-25 NOTE — Progress Notes (Signed)
ROB

## 2022-06-25 NOTE — Progress Notes (Signed)
   PRENATAL VISIT NOTE  Subjective:  Marisa Pearson is a 27 y.o. G3P2002 at [redacted]w[redacted]d being seen today for ongoing prenatal care.  She is currently monitored for the following issues for this low-risk pregnancy and has Supervision of other normal pregnancy, antepartum and Sickle cell trait ([place]) on their problem list.  Patient reports no complaints.  Contractions: Not present. Vag. Bleeding: None.  Movement: Present. Denies leaking of fluid.   The following portions of the patient's history were reviewed and updated as appropriate: allergies, current medications, past family history, past medical history, past social history, past surgical history and problem list.   Objective:   Vitals:   06/25/22 1043  BP: 115/73  Pulse: 84  Weight: 181 lb (82.1 kg)    Fetal Status: Fetal Heart Rate (bpm): 152   Movement: Present     General:  Alert, oriented and cooperative. Patient is in no acute distress.  Skin: Skin is warm and dry. No rash noted.   Cardiovascular: Normal heart rate noted  Respiratory: Normal respiratory effort, no problems with respiration noted  Abdomen: Soft, gravid, appropriate for gestational age.  Pain/Pressure: Absent     Pelvic: Cervical exam deferred        Extremities: Normal range of motion.     Mental Status: Normal mood and affect. Normal behavior. Normal judgment and thought content.   Assessment and Plan:  Pregnancy: G3P2002 at [redacted]w[redacted]d 1. Supervision of other normal pregnancy, antepartum Doing well Talked about support after delivery-- she is trying to have mother come to [place]. Patient was previously given a letter She also asked about if she goes into labor because she has a 21 and 27 yo. We discussed the the restrictions have been lifted and the children can come to the hospital but they need an adult to care for them. Discussed trying to reach out to her neighbors and other parents for support if needed Offered a letter patient's husband to have more local  work around due date Discussed that I cannot control the [place] embassy but if additional supportive letters are needed we can help at our office.   Preterm labor symptoms and general obstetric precautions including but not limited to vaginal bleeding, contractions, leaking of fluid and fetal movement were reviewed in detail with the patient. Please refer to After Visit Summary for other counseling recommendations.   Return in about 4 weeks (around 07/23/2022) for Routine prenatal care.  Future Appointments  Date Time Provider [place]  07/21/2022  8:30 AM [facility] NURSE [facility]  07/21/2022  8:45 AM [facility] US5 [facility]  07/23/2022  8:15 AM [facility] LAB [facility]  07/23/2022  8:55 AM Ernestina Patches, Juanita Craver, MD [facility]  08/13/2022 10:55 AM Caren Macadam, MD [facility]  08/27/2022  8:55 AM Ernestina Patches, Juanita Craver, MD [facility]    Caren Macadam, MD

## 2022-06-25 NOTE — Progress Notes (Signed)
ROB   Pt states she her mother here for delivery. Letter was provided for embassy. Yet mother's appt is 11/2022 pt EDD is 10/20/22 pt unsure what to do.

## 2022-07-21 ENCOUNTER — Ambulatory Visit: Payer: Medicaid Other | Attending: Obstetrics and Gynecology

## 2022-07-21 ENCOUNTER — Ambulatory Visit: Payer: Medicaid Other | Admitting: *Deleted

## 2022-07-21 VITALS — BP 110/61 | HR 86

## 2022-07-21 DIAGNOSIS — O99019 Anemia complicating pregnancy, unspecified trimester: Secondary | ICD-10-CM | POA: Diagnosis not present

## 2022-07-21 DIAGNOSIS — D573 Sickle-cell trait: Secondary | ICD-10-CM

## 2022-07-21 DIAGNOSIS — O99212 Obesity complicating pregnancy, second trimester: Secondary | ICD-10-CM | POA: Insufficient documentation

## 2022-07-21 DIAGNOSIS — Z3A27 27 weeks gestation of pregnancy: Secondary | ICD-10-CM | POA: Diagnosis not present

## 2022-07-21 DIAGNOSIS — E669 Obesity, unspecified: Secondary | ICD-10-CM

## 2022-07-23 ENCOUNTER — Other Ambulatory Visit: Payer: Medicaid Other

## 2022-07-23 ENCOUNTER — Encounter: Payer: Self-pay | Admitting: Family Medicine

## 2022-07-23 ENCOUNTER — Ambulatory Visit (INDEPENDENT_AMBULATORY_CARE_PROVIDER_SITE_OTHER): Payer: Medicaid Other | Admitting: Family Medicine

## 2022-07-23 VITALS — BP 120/78 | HR 93 | Wt 179.0 lb

## 2022-07-23 DIAGNOSIS — D573 Sickle-cell trait: Secondary | ICD-10-CM

## 2022-07-23 DIAGNOSIS — Z3480 Encounter for supervision of other normal pregnancy, unspecified trimester: Secondary | ICD-10-CM

## 2022-07-23 DIAGNOSIS — B372 Candidiasis of skin and nail: Secondary | ICD-10-CM

## 2022-07-23 MED ORDER — NYSTATIN 100000 UNIT/GM EX POWD: 1.0000 | Freq: Three times a day (TID) | CUTANEOUS | 0 refills | Status: DC

## 2022-07-23 NOTE — Progress Notes (Signed)
   PRENATAL VISIT NOTE  Subjective:  Marisa Pearson is a 27 y.o. G3P2002 at [redacted]w[redacted]d being seen today for ongoing prenatal care.  She is currently monitored for the following issues for this low-risk pregnancy and has Supervision of other normal pregnancy, antepartum and Sickle cell trait on their problem list.  Patient reports no complaints.  Contractions: Not present. Vag. Bleeding: None.  Movement: Present. Denies leaking of fluid.   The following portions of the patient's history were reviewed and updated as appropriate: allergies, current medications, past family history, past medical history, past social history, past surgical history and problem list.   Objective:   Vitals:   07/23/22 0850  BP: 120/78  Pulse: 93  Weight: 179 lb (81.2 kg)    Fetal Status: Fetal Heart Rate (bpm): 141   Movement: Present     General:  Alert, oriented and cooperative. Patient is in no acute distress.  Skin: Skin is warm and dry. No rash noted.   Cardiovascular: Normal heart rate noted  Respiratory: Normal respiratory effort, no problems with respiration noted  Abdomen: Soft, gravid, appropriate for gestational age.  Pain/Pressure: Present     Pelvic: Cervical exam deferred        Extremities: Normal range of motion.  Edema: None  Mental Status: Normal mood and affect. Normal behavior. Normal judgment and thought content.   Assessment and Plan:  Pregnancy: G3P2002 at [redacted]w[redacted]d 1. Supervision of other normal pregnancy, antepartum Patient completing labs today Provided another letter for embassy-- patient needs home support for care of other children after delivery. Trying to get mother a visa to come for extended time. Her husband is the sole income and works nights/weekends driving truck Encouraged her husband to write up is work schedule for collateral Discussed pelvic pressure as expected and normal Reviewed taking pills -- patient asking because of ASA and prenatals. She can take the pills  but prefers not to. Discussed taking with more water.  Reviewed that skin darkening is normal in pregnancy.   Preterm labor symptoms and general obstetric precautions including but not limited to vaginal bleeding, contractions, leaking of fluid and fetal movement were reviewed in detail with the patient. Please refer to After Visit Summary for other counseling recommendations.   No follow-ups on file.  Future Appointments  Date Time Provider Department Center  07/26/2022  9:30 AM [facility]  08/13/2022 10:55 AM Federico Flake, MD [facility]  08/25/2022 10:35 AM Reva Bores, MD [facility]  08/27/2022  8:55 AM Federico Flake, MD [facility]  09/10/2022  9:35 AM Federico Flake, MD [facility]  09/24/2022  9:35 AM Federico Flake, MD [facility]    Federico Flake, MD

## 2022-07-23 NOTE — Progress Notes (Unsigned)
ROB   CC: skin discoloration, pressure, trouble swallowing pills.   Has questions and concerns about taking aspirin.

## 2022-07-26 ENCOUNTER — Other Ambulatory Visit: Payer: Medicaid Other

## 2022-07-26 DIAGNOSIS — Z3480 Encounter for supervision of other normal pregnancy, unspecified trimester: Secondary | ICD-10-CM | POA: Diagnosis not present

## 2022-07-26 NOTE — Progress Notes (Signed)
Pt drank milk, willing to do one hour GTT and understands if she doesn't pass, she will need to do 3 hour/

## 2022-07-26 NOTE — Addendum Note (Signed)
Addended by: Scheryl Marten on: 07/26/2022 09:16 AM   Modules accepted: Orders

## 2022-07-27 LAB — CBC
Hematocrit: 35.4 % (ref 34.0–46.6)
Hemoglobin: 11.8 g/dL (ref 11.1–15.9)
MCH: 28 pg (ref 26.6–33.0)
MCHC: 33.3 g/dL (ref 31.5–35.7)
MCV: 84 fL (ref 79–97)
Platelets: 204 10*3/uL (ref 150–450)
RBC: 4.21 x10E6/uL (ref 3.77–5.28)
RDW: 13.2 % (ref 11.7–15.4)
WBC: 8.6 10*3/uL (ref 3.4–10.8)

## 2022-07-27 LAB — HIV ANTIBODY (ROUTINE TESTING W REFLEX): HIV Screen 4th Generation wRfx: NONREACTIVE

## 2022-07-27 LAB — GLUCOSE TOLERANCE, 1 HOUR: Glucose, 1Hr PP: 78 mg/dL (ref 70–199)

## 2022-07-27 LAB — RPR: RPR Ser Ql: NONREACTIVE

## 2022-08-03 ENCOUNTER — Inpatient Hospital Stay (HOSPITAL_COMMUNITY)
Admission: AD | Admit: 2022-08-03 | Discharge: 2022-08-04 | Disposition: A | Discharge status: Home / Self Care | Payer: Medicaid Other | Attending: Obstetrics and Gynecology | Admitting: Obstetrics and Gynecology

## 2022-08-03 ENCOUNTER — Inpatient Hospital Stay (HOSPITAL_COMMUNITY): Payer: Medicaid Other

## 2022-08-03 ENCOUNTER — Encounter (HOSPITAL_COMMUNITY): Payer: Self-pay | Admitting: Obstetrics and Gynecology

## 2022-08-03 DIAGNOSIS — O99513 Diseases of the respiratory system complicating pregnancy, third trimester: Secondary | ICD-10-CM | POA: Diagnosis not present

## 2022-08-03 DIAGNOSIS — R059 Cough, unspecified: Secondary | ICD-10-CM | POA: Diagnosis not present

## 2022-08-03 DIAGNOSIS — R062 Wheezing: Secondary | ICD-10-CM

## 2022-08-03 DIAGNOSIS — R051 Acute cough: Secondary | ICD-10-CM | POA: Diagnosis not present

## 2022-08-03 DIAGNOSIS — J189 Pneumonia, unspecified organism: Secondary | ICD-10-CM

## 2022-08-03 DIAGNOSIS — Z3A29 29 weeks gestation of pregnancy: Secondary | ICD-10-CM | POA: Diagnosis not present

## 2022-08-03 DIAGNOSIS — Z20822 Contact with and (suspected) exposure to covid-19: Secondary | ICD-10-CM | POA: Diagnosis not present

## 2022-08-03 LAB — URINALYSIS, ROUTINE W REFLEX MICROSCOPIC
Bilirubin Urine: NEGATIVE
Glucose, UA: NEGATIVE mg/dL
Hgb urine dipstick: NEGATIVE
Ketones, ur: NEGATIVE mg/dL
Leukocytes,Ua: NEGATIVE
Nitrite: NEGATIVE
Protein, ur: NEGATIVE mg/dL
Specific Gravity, Urine: 1.004 — ABNORMAL LOW (ref 1.005–1.030)
pH: 6 (ref 5.0–8.0)

## 2022-08-03 MED ORDER — ALBUTEROL SULFATE (2.5 MG/3ML) 0.083% IN NEBU
2.5000 mg | INHALATION_SOLUTION | Freq: Once | RESPIRATORY_TRACT | Status: AC
  Administered 2022-08-03: 2.5 mg via RESPIRATORY_TRACT
  Filled 2022-08-03: qty 3

## 2022-08-03 NOTE — MAU Provider Note (Incomplete)
Chief Complaint:  No chief complaint on file.   Event Date/Time   First Provider Initiated Contact with Patient 08/03/22 2308     HPI: Marisa Pearson is a 27 y.o. G3P2002 at 16w6dwho presents to maternity admissions reporting cough for 2 weeks with increased substernal pain when she coughs and feeling short of breath. Taking Robitussin but it is not helping.  Has not mentioned it in her OB visits.  Got better for a while then worse again.. She reports good fetal movement, denies LOF, vaginal bleeding, h/a, dizziness, n/v.    Shortness of Breath This is a new problem. The current episode started 1 to 4 weeks ago. Associated symptoms include chest pain (with coughing, substernal), rhinorrhea, sputum production and wheezing. Pertinent negatives include no abdominal pain, fever, headaches or leg swelling. Nothing aggravates the symptoms.  Cough This is a recurrent problem. The current episode started 1 to 4 weeks ago. Associated symptoms include chest pain (with coughing, substernal), rhinorrhea, shortness of breath and wheezing. Pertinent negatives include no fever or headaches. She has tried OTC cough suppressant for the symptoms. The treatment provided no relief.   RN Note: Marisa Pearson is a 27 y.o. at [redacted]w[redacted]d here in MAU reporting: coughing and fever for two weeks but coughing has become more severe overnight and reports shortness of breath. Pt reports she can't sleep and has vomited 3 times in the past 24 hours.     No LOF and No Bleeding.   Pt took Robitussin at 5pm and she could not keep the medicine down.   Chest pain 10/10 when she coughs.    Past Medical History: Past Medical History:  Diagnosis Date   Medical history non-contributory    Ovarian cyst affecting pregnancy, antepartum     Past obstetric history: OB History  Gravida Para Term Preterm AB Living  3 2 2     2   SAB IAB Ectopic Multiple Live Births        0 2    # Outcome Date GA Lbr Len/2nd Weight Sex  Delivery Anes PTL Lv  3 Current           2 Term 05/19/19 [redacted]w[redacted]d 03:34 / 00:05 2963 g M Vag-Spont EPI  LIV  1 Term 02/04/17 [redacted]w[redacted]d 17:04 / 00:32 2974 g M Vag-Spont EPI  LIV    Past Surgical History: Past Surgical History:  Procedure Laterality Date   EXCISION OF BREAST BIOPSY Bilateral 11/28/2018   Procedure: EXCISION OF BILATERAL AXILLARY ACCESSORY BREAST TISSUE;  Surgeon: Manus Rudd, MD;  Location: [facility] OR;  Service: General;  Laterality: Bilateral;    Family History: Family History  Problem Relation Age of Onset   Hypertension Maternal Grandmother    Hypertension Maternal Grandfather     Social History: Social History   Tobacco Use   Smoking status: Never   Smokeless tobacco: Never  Vaping Use   Vaping Use: Never used  Substance Use Topics   Alcohol use: No   Drug use: No    Allergies:  Allergies  Allergen Reactions   Peanut-Containing Drug Products Swelling    SWELLING REACTION UNSPECIFIED    Pork-Derived Products Other (See Comments)    Muslim, no pork    Meds:  Medications Prior to Admission  Medication Sig Dispense Refill Last Dose   aspirin EC 81 MG tablet Take 1 tablet (81 mg total) by mouth daily. Start taking when you are [redacted] weeks pregnant for rest of pregnancy for prevention of preeclampsia  300 tablet 2 08/03/2022   Prenatal Vit-Fe Fumarate-FA (PRENATAL MULTIVITAMIN) TABS tablet Take 1 tablet by mouth daily at 12 noon.   08/03/2022   nystatin (MYCOSTATIN/NYSTOP) powder Apply 1 Application topically 3 (three) times daily. 15 g 0    nystatin cream (MYCOSTATIN) Apply 1 application topically 2 (two) times daily. (Patient not taking: Reported on 07/30/2021) 30 g 1     I have reviewed patient's Past Medical Hx, Surgical Hx, Family Hx, Social Hx, medications and allergies.   ROS:  Review of Systems  Constitutional:  Negative for fever.  HENT:  Positive for rhinorrhea.   Respiratory:  Positive for cough, sputum production, shortness of breath and wheezing.    Cardiovascular:  Positive for chest pain (with coughing, substernal). Negative for leg swelling.  Gastrointestinal:  Negative for abdominal pain.  Neurological:  Negative for headaches.   Other systems negative  Physical Exam  Patient Vitals for the past 24 hrs:  BP Temp Temp src Pulse Resp SpO2 Height Weight  08/03/22 2300 126/83 98.4 F (36.9 C) Oral 92 16 99 % 5' (1.524 m) 82.2 kg   Vitals:   08/04/22 0000 08/04/22 0010 08/04/22 0020 08/04/22 0030  BP: 117/66   111/61  Pulse: 85   85  Resp:    17  Temp:      TempSrc:      SpO2: 99% 98% 99% 98%  Weight:      Height:        Constitutional: Well-developed, well-nourished female in no acute distress.  Cardiovascular: normal rate and rhythm Respiratory: normal effort, Expiratory wheezes heard by  auscultation bilaterally, no rales appreciated GI: Abd soft, non-tender, gravid appropriate for gestational age.   No rebound or guarding. MS: Extremities nontender, no edema, normal ROM Neurologic: Alert and oriented x 4.  GU: Neg CVAT.    FHT:  Baseline 145 , moderate variability, accelerations present, no decelerations Contractions:   Rare   Labs: Results for orders placed or performed during the hospital encounter of 08/03/22 (from the past 24 hour(s))  Urinalysis, Routine w reflex microscopic -Urine, Clean Catch     Status: Abnormal   Collection Time: 08/03/22 10:58 PM  Result Value Ref Range   Color, Urine STRAW (A) YELLOW   APPearance HAZY (A) CLEAR   Specific Gravity, Urine 1.004 (L) 1.005 - 1.030   pH 6.0 5.0 - 8.0   Glucose, UA NEGATIVE NEGATIVE mg/dL   Hgb urine dipstick NEGATIVE NEGATIVE   Bilirubin Urine NEGATIVE NEGATIVE   Ketones, ur NEGATIVE NEGATIVE mg/dL   Protein, ur NEGATIVE NEGATIVE mg/dL   Nitrite NEGATIVE NEGATIVE   Leukocytes,Ua NEGATIVE NEGATIVE  Protein / creatinine ratio, urine     Status: None   Collection Time: 08/03/22 10:58 PM  Result Value Ref Range   Creatinine, Urine 29 mg/dL   Total  Protein, Urine <6 mg/dL   Protein Creatinine Ratio        0.00 - 0.15 mg/mg[Cre]  SARS Coronavirus 2 by RT PCR (hospital order, performed in [facility] hospital lab) *cepheid single result test* Anterior Nasal Swab     Status: None   Collection Time: 08/03/22 11:24 PM   Specimen: Anterior Nasal Swab  Result Value Ref Range   SARS Coronavirus 2 by RT PCR NEGATIVE NEGATIVE  CBC     Status: Abnormal   Collection Time: 08/03/22 11:35 PM  Result Value Ref Range   WBC 9.2 4.0 - 10.5 K/uL   RBC 4.28 3.87 - 5.11 MIL/uL  Hemoglobin 11.8 (L) 12.0 - 15.0 g/dL   HCT 16.1 (L) 09.6 - 04.5 %   MCV 81.3 80.0 - 100.0 fL   MCH 27.6 26.0 - 34.0 pg   MCHC 33.9 30.0 - 36.0 g/dL   RDW 40.9 81.1 - 91.4 %   Platelets 208 150 - 400 K/uL   nRBC 0.0 0.0 - 0.2 %  Comprehensive metabolic panel     Status: Abnormal   Collection Time: 08/03/22 11:35 PM  Result Value Ref Range   Sodium 135 135 - 145 mmol/L   Potassium 3.4 (L) 3.5 - 5.1 mmol/L   Chloride 103 98 - 111 mmol/L   CO2 21 (L) 22 - 32 mmol/L   Glucose, Bld 86 70 - 99 mg/dL   BUN <5 (L) 6 - 20 mg/dL   Creatinine, Ser 7.82 0.44 - 1.00 mg/dL   Calcium 8.9 8.9 - 95.6 mg/dL   Total Protein 6.3 (L) 6.5 - 8.1 g/dL   Albumin 2.8 (L) 3.5 - 5.0 g/dL   AST 15 15 - 41 U/L   ALT 16 0 - 44 U/L   Alkaline Phosphatase 61 38 - 126 U/L   Total Bilirubin 0.3 0.3 - 1.2 mg/dL   GFR, Estimated >21 >30 mL/min   Anion gap 11 5 - 15    O/Positive/-- (12/29 1210)  Imaging:  DG CHEST PORT 1 VIEW  Result Date: 08/03/2022 CLINICAL DATA:  Cough wheezing [redacted] weeks pregnant EXAM: PORTABLE CHEST 1 VIEW COMPARISON:  None Available. FINDINGS: Low lung volumes. Borderline cardiomegaly. Patchy left lung base opacity. No pleural effusion or pneumothorax IMPRESSION: Low lung volumes with patchy left lung base opacity which may be due to atelectasis or developing pneumonia. Borderline cardiomegaly. Electronically Signed   By: Jasmine Pang M.D.   On: 08/03/2022 23:52     MAU  Course/MDM: I have reviewed the triage vital signs and the nursing notes.   Pertinent labs & imaging results that were available during my care of the patient were reviewed by me and considered in my medical decision making (see chart for details).      I have reviewed her medical records including past results, notes and treatments.   I have ordered labs and reviewed results. One single elevated BP taken during coughing, others WNL NST reviewed Albuterol nebulizer treatment given Consult Dr Berton Lan with presentation, exam findings and test results. Will treat as community acquired pneumonia  Assessment: Single IUP at [redacted]w[redacted]d Cough Wheezing Community Acquired Pneumonia  Plan: Discharge home Rx Amoxicillin and Zithromax for CAP Rx Muncinex for cough Rx Tussionex for cough suppression only at night Preterm Labor precautions and fetal kick counts Follow up in Office for prenatal visits and recheck Encouraged to return if she develops worsening of symptoms, increase in pain, fever, or other concerning symptoms.   Pt stable at time of discharge.  Wynelle Bourgeois CNM, MSN Certified Nurse-Midwife 08/03/2022 11:08 PM

## 2022-08-03 NOTE — MAU Note (Signed)
.  Marisa Pearson is a 27 y.o. at [redacted]w[redacted]d here in MAU reporting: coughing and fever for two weeks but coughing has become more severe overnight and reports shortness of breath. Pt reports she can't sleep and has vomited 3 times in the past 24 hours.   No LOF and No Bleeding.   Pt took Robitussin at 5pm and she could not keep the medicine down.   Chest pain 10/10 when she coughs.   FHT: 150   Vitals:   08/03/22 2300  BP: 126/83  Pulse: 92  Resp: 16  Temp: 98.4 F (36.9 C)  SpO2: 99%       Lab orders placed from triage:  UA.

## 2022-08-04 DIAGNOSIS — Z3A29 29 weeks gestation of pregnancy: Secondary | ICD-10-CM | POA: Diagnosis not present

## 2022-08-04 DIAGNOSIS — J189 Pneumonia, unspecified organism: Secondary | ICD-10-CM | POA: Diagnosis not present

## 2022-08-04 DIAGNOSIS — R062 Wheezing: Secondary | ICD-10-CM | POA: Diagnosis not present

## 2022-08-04 DIAGNOSIS — R051 Acute cough: Secondary | ICD-10-CM

## 2022-08-04 LAB — COMPREHENSIVE METABOLIC PANEL
ALT: 16 U/L (ref 0–44)
AST: 15 U/L (ref 15–41)
Albumin: 2.8 g/dL — ABNORMAL LOW (ref 3.5–5.0)
Alkaline Phosphatase: 61 U/L (ref 38–126)
Anion gap: 11 (ref 5–15)
BUN: <5 mg/dL — ABNORMAL LOW (ref 6–20)
CO2: 21 mmol/L — ABNORMAL LOW (ref 22–32)
Calcium: 8.9 mg/dL (ref 8.9–10.3)
Chloride: 103 mmol/L (ref 98–111)
Creatinine, Ser: 0.52 mg/dL (ref 0.44–1.00)
GFR, Estimated: >60 mL/min (ref >60)
Glucose, Bld: 86 mg/dL (ref 70–99)
Potassium: 3.4 mmol/L — ABNORMAL LOW (ref 3.5–5.1)
Sodium: 135 mmol/L (ref 135–145)
Total Bilirubin: 0.3 mg/dL (ref 0.3–1.2)
Total Protein: 6.3 g/dL — ABNORMAL LOW (ref 6.5–8.1)

## 2022-08-04 LAB — CBC
HCT: 34.8 % — ABNORMAL LOW (ref 36.0–46.0)
Hemoglobin: 11.8 g/dL — ABNORMAL LOW (ref 12.0–15.0)
MCH: 27.6 pg (ref 26.0–34.0)
MCHC: 33.9 g/dL (ref 30.0–36.0)
MCV: 81.3 fL (ref 80.0–100.0)
Platelets: 208 10*3/uL (ref 150–400)
RBC: 4.28 MIL/uL (ref 3.87–5.11)
RDW: 12.8 % (ref 11.5–15.5)
WBC: 9.2 10*3/uL (ref 4.0–10.5)
nRBC: 0 % (ref 0.0–0.2)

## 2022-08-04 LAB — PROTEIN / CREATININE RATIO, URINE
Creatinine, Urine: 29 mg/dL
Total Protein, Urine: <6 mg/dL

## 2022-08-04 LAB — SARS CORONAVIRUS 2 BY RT PCR: SARS Coronavirus 2 by RT PCR: NEGATIVE

## 2022-08-04 MED ORDER — AMOXICILLIN 500 MG PO CAPS: 1000.0000 mg | ORAL_CAPSULE | Freq: Three times a day (TID) | ORAL | 0 refills | Status: AC

## 2022-08-04 MED ORDER — HYDROCOD POLI-CHLORPHE POLI ER 10-8 MG/5ML PO SUER: 5.0000 mL | Freq: Two times a day (BID) | ORAL | 0 refills | Status: DC | PRN

## 2022-08-04 MED ORDER — ONDANSETRON 4 MG PO TBDP: 4.0000 mg | ORAL_TABLET | Freq: Four times a day (QID) | ORAL | 0 refills | Status: DC | PRN

## 2022-08-04 MED ORDER — AZITHROMYCIN 250 MG PO TABS: ORAL_TABLET | ORAL | 0 refills | Status: DC

## 2022-08-04 MED ORDER — GUAIFENESIN ER 600 MG PO TB12: 600.0000 mg | ORAL_TABLET | Freq: Two times a day (BID) | ORAL | 0 refills | Status: DC | PRN

## 2022-08-13 ENCOUNTER — Ambulatory Visit (INDEPENDENT_AMBULATORY_CARE_PROVIDER_SITE_OTHER): Payer: Medicaid Other | Admitting: Family Medicine

## 2022-08-13 VITALS — BP 119/77 | HR 96 | Wt 180.0 lb

## 2022-08-13 DIAGNOSIS — J189 Pneumonia, unspecified organism: Secondary | ICD-10-CM

## 2022-08-13 DIAGNOSIS — Z3480 Encounter for supervision of other normal pregnancy, unspecified trimester: Secondary | ICD-10-CM

## 2022-08-13 MED ORDER — BENZONATATE 100 MG PO CAPS
200.0000 mg | ORAL_CAPSULE | Freq: Three times a day (TID) | ORAL | 0 refills | Status: DC | PRN
Start: 2022-08-13 — End: 2022-10-17

## 2022-08-13 NOTE — Progress Notes (Signed)
ROB   Recent MAU visit due to SOB.reports had a severe cold /cough. Pt was not able to sleep or breathe. Still notes cough and discomfort in pelvic area when coughing Notes braxtn hicks ctxs.

## 2022-08-13 NOTE — Progress Notes (Signed)
ROB

## 2022-08-13 NOTE — Patient Instructions (Addendum)
Safe Medications in Pregnancy    Colds/Coughs/Allergies: Benadryl (alcohol free) 25 mg every 6 hours as needed Breath right strips Claritin Cepacol throat lozenges Chloraseptic throat spray Cold-Eeze- up to three times per day Cough drops, alcohol free Flonase ( Guaifenesin Mucinex Robitussin DM (plain only, alcohol free) Saline nasal spray/drops Sudafed (pseudoephedrine) & Actifed ** use only after [redacted] weeks gestation and if you do not have high blood pressure Tylenol Vicks Vaporub Zinc lozenges Zyrtec

## 2022-08-13 NOTE — Progress Notes (Signed)
   PRENATAL VISIT NOTE  Subjective:  Marisa Pearson is a 27 y.o. G3P2002 at [redacted]w[redacted]d being seen today for ongoing prenatal care.  She is currently monitored for the following issues for this low-risk pregnancy and has Supervision of other normal pregnancy, antepartum; Sickle cell trait (HCC); and Community acquired pneumonia on their problem list.  Patient reports no complaints.  Contractions: Not present. Vag. Bleeding: None.  Movement: Present. Denies leaking of fluid.   The following portions of the patient's history were reviewed and updated as appropriate: allergies, current medications, past family history, past medical history, past social history, past surgical history and problem list.   Objective:   Vitals:   08/13/22 1103  BP: 119/77  Pulse: 96  Weight: 180 lb (81.6 kg)    Fetal Status: Fetal Heart Rate (bpm): 144 Fundal Height: 31 cm Movement: Present     General:  Alert, oriented and cooperative. Patient is in no acute distress.  Skin: Skin is warm and dry. No rash noted.   Cardiovascular: Normal heart rate noted  Respiratory: Normal respiratory effort, no problems with respiration noted  Abdomen: Soft, gravid, appropriate for gestational age.  Pain/Pressure: Present     Pelvic: Cervical exam deferred        Extremities: Normal range of motion.  Edema: Trace  Mental Status: Normal mood and affect. Normal behavior. Normal judgment and thought content.   Assessment and Plan:  Pregnancy: G3P2002 at [redacted]w[redacted]d 1. Community acquired pneumonia, unspecified laterality Provided support and reviewed that resolution of cough can take > 4 weeks The cough is actually improving - She reports mucinex makes her sleepy - Provided OTC list and rx per below to help with cough - benzonatate (TESSALON PERLES) 100 MG capsule; Take 2 capsules (200 mg total) by mouth 3 (three) times daily as needed for cough.  Dispense: 20 capsule; Refill: 0  2. Supervision of other normal pregnancy,  antepartum Up to date Baby moving well FH appropriate Wearing pregnancy support belt Patient is from [place] and just graduated from [employer] with a associates in Business!! We had a nice talk about African fabrics and her desire to have a job where she can work in [place] and [place]. She speaks hausa and is teaching her children her native langauge.    Preterm labor symptoms and general obstetric precautions including but not limited to vaginal bleeding, contractions, leaking of fluid and fetal movement were reviewed in detail with the patient. Please refer to After Visit Summary for other counseling recommendations.   Return in about 2 weeks (around 08/27/2022) for Routine prenatal care.  Future Appointments  Date Time Provider Department Center  08/25/2022 10:35 AM Reva Bores, MD [facility]  08/27/2022  8:55 AM Federico Flake, MD [facility]  09/10/2022  9:35 AM Federico Flake, MD [facility]  09/24/2022  9:35 AM Federico Flake, MD [facility]    Federico Flake, MD

## 2022-08-19 ENCOUNTER — Encounter: Payer: Self-pay | Admitting: *Deleted

## 2022-08-19 DIAGNOSIS — Z3482 Encounter for supervision of other normal pregnancy, second trimester: Secondary | ICD-10-CM | POA: Diagnosis not present

## 2022-08-19 DIAGNOSIS — Z3483 Encounter for supervision of other normal pregnancy, third trimester: Secondary | ICD-10-CM | POA: Diagnosis not present

## 2022-08-25 ENCOUNTER — Encounter: Payer: Medicaid Other | Admitting: Family Medicine

## 2022-08-27 ENCOUNTER — Encounter: Payer: Self-pay | Admitting: Family Medicine

## 2022-08-27 ENCOUNTER — Telehealth (INDEPENDENT_AMBULATORY_CARE_PROVIDER_SITE_OTHER): Payer: Medicaid Other | Admitting: Family Medicine

## 2022-08-27 VITALS — BP 112/67 | HR 85

## 2022-08-27 DIAGNOSIS — Z3483 Encounter for supervision of other normal pregnancy, third trimester: Secondary | ICD-10-CM

## 2022-08-27 DIAGNOSIS — Z3480 Encounter for supervision of other normal pregnancy, unspecified trimester: Secondary | ICD-10-CM

## 2022-08-27 DIAGNOSIS — Z3A32 32 weeks gestation of pregnancy: Secondary | ICD-10-CM

## 2022-08-27 NOTE — Progress Notes (Signed)
   OBSTETRICS PRENATAL VIRTUAL VISIT ENCOUNTER NOTE  Provider location: Center for [facility]   Patient location: Home  I connected with Altamese [place] on 08/27/22 at  8:55 AM EDT by MyChart Video Encounter and verified that I am speaking with the correct person using two identifiers. I discussed the limitations, risks, security and privacy concerns of performing an evaluation and management service virtually and the availability of in person appointments. I also discussed with the patient that there may be a patient responsible charge related to this service. The patient expressed understanding and agreed to proceed.  Subjective:  Marisa Pearson is a 27 y.o. G3P2002 at [redacted]w[redacted]d being seen today for ongoing prenatal care.  She is currently monitored for the following issues for this low-risk pregnancy and has Supervision of other normal pregnancy, antepartum; Sickle cell trait (HCC); and Community acquired pneumonia on their problem list.  Patient reports no complaints.  Contractions: Irritability. Vag. Bleeding: None.  Movement: Present. Denies any leaking of fluid.   The following portions of the patient's history were reviewed and updated as appropriate: allergies, current medications, past family history, past medical history, past social history, past surgical history and problem list.   Objective:   Vitals:   08/27/22 0859  BP: 112/67  Pulse: 85    Fetal Status:     Movement: Present     General:  Alert, oriented and cooperative. Patient is in no acute distress.  Respiratory: Normal respiratory effort, no problems with respiration noted  Mental Status: Normal mood and affect. Normal behavior. Normal judgment and thought content.  Rest of physical exam deferred due to type of encounter  Imaging: DG CHEST PORT 1 VIEW  Result Date: 08/03/2022 CLINICAL DATA:  Cough wheezing [redacted] weeks pregnant EXAM: PORTABLE CHEST 1 VIEW COMPARISON:  None  Available. FINDINGS: Low lung volumes. Borderline cardiomegaly. Patchy left lung base opacity. No pleural effusion or pneumothorax IMPRESSION: Low lung volumes with patchy left lung base opacity which may be due to atelectasis or developing pneumonia. Borderline cardiomegaly. Electronically Signed   By: Jasmine Pang M.D.   On: 08/03/2022 23:52    Assessment and Plan:  Pregnancy: G3P2002 at [redacted]w[redacted]d  1. Supervision of other normal pregnancy, antepartum Mother is arriving June 20th! Having BH contractions, nipples sore/sensitive, pelvic pain- belt is helping.  CAP sx are greatly improves, occasional cough.   2. 32 week prematurity   Preterm labor symptoms and general obstetric precautions including but not limited to vaginal bleeding, contractions, leaking of fluid and fetal movement were reviewed in detail with the patient. I discussed the assessment and treatment plan with the patient. The patient was provided an opportunity to ask questions and all were answered. The patient agreed with the plan and demonstrated an understanding of the instructions. The patient was advised to call back or seek an in-person office evaluation/go to MAU at [facility] for any urgent or concerning symptoms. Please refer to After Visit Summary for other counseling recommendations.   I provided 15 minutes of face-to-face time during this encounter.  Return in about 2 weeks (around 09/10/2022) for Routine prenatal care, MD or APP.  Future Appointments  Date Time Provider Department Center  09/10/2022  9:35 AM Federico Flake, MD [facility]  09/24/2022  9:35 AM Federico Flake, MD [facility]    Federico Flake, MD Center for [facility], [facility]

## 2022-08-31 ENCOUNTER — Ambulatory Visit: Payer: Medicaid Other | Admitting: Family

## 2022-09-10 ENCOUNTER — Ambulatory Visit (INDEPENDENT_AMBULATORY_CARE_PROVIDER_SITE_OTHER): Payer: Medicaid Other | Admitting: Family Medicine

## 2022-09-10 VITALS — BP 114/73 | HR 86 | Wt 184.0 lb

## 2022-09-10 DIAGNOSIS — Z3480 Encounter for supervision of other normal pregnancy, unspecified trimester: Secondary | ICD-10-CM

## 2022-09-10 NOTE — Progress Notes (Signed)
   PRENATAL VISIT NOTE  Subjective:  Marisa Pearson is a 26 y.o. G3P2002 at [redacted]w[redacted]d being seen today for ongoing prenatal care.  She is currently monitored for the following issues for this low-risk pregnancy and has Supervision of other normal pregnancy, antepartum; Sickle cell trait (HCC); and Community acquired pneumonia on their problem list.  Patient reports no complaints.  Contractions: Irritability. Vag. Bleeding: None.  Movement: Present. Denies leaking of fluid.   The following portions of the patient's history were reviewed and updated as appropriate: allergies, current medications, past family history, past medical history, past social history, past surgical history and problem list.   Objective:   Vitals:   09/10/22 0954  BP: 114/73  Pulse: 86  Weight: 184 lb (83.5 kg)    Fetal Status: Fetal Heart Rate (bpm): 148   Movement: Present     General:  Alert, oriented and cooperative. Patient is in no acute distress.  Skin: Skin is warm and dry. No rash noted.   Cardiovascular: Normal heart rate noted  Respiratory: Normal respiratory effort, no problems with respiration noted  Abdomen: Soft, gravid, appropriate for gestational age.  Pain/Pressure: Present     Pelvic: Cervical exam deferred        Extremities: Normal range of motion.  Edema: None  Mental Status: Normal mood and affect. Normal behavior. Normal judgment and thought content.   Assessment and Plan:  Pregnancy: G3P2002 at [redacted]w[redacted]d 1. Supervision of other normal pregnancy, antepartum Up to date No concern today FH is appropriate Reviewed use of pregnancy support belt Mother is coming! This will be the first time Marisa Pearson has seen her mom since 2017  Preterm labor symptoms and general obstetric precautions including but not limited to vaginal bleeding, contractions, leaking of fluid and fetal movement were reviewed in detail with the patient. Please refer to After Visit Summary for other counseling  recommendations.   Return in about 2 weeks (around 09/24/2022) for Routine prenatal care, 36wks.  Future Appointments  Date Time Provider Department Center  09/24/2022  9:35 AM Federico Flake, MD [facility]  10/01/2022  9:35 AM Barnum Bing, MD [facility]  10/08/2022  9:35 AM Reva Bores, MD [facility]  10/15/2022 10:15 AM Federico Flake, MD [facility]    Federico Flake, MD

## 2022-09-10 NOTE — Progress Notes (Signed)
ROB   CC: Pelvic pain and hip pain and pain with intercourse.   Notes wearing a maternity belt yet not helping

## 2022-09-24 ENCOUNTER — Other Ambulatory Visit (HOSPITAL_COMMUNITY)
Admission: RE | Admit: 2022-09-24 | Discharge: 2022-09-24 | Disposition: A | Discharge status: Home / Self Care | Payer: Medicaid Other | Source: Ambulatory Visit | Attending: Family Medicine | Admitting: Family Medicine

## 2022-09-24 ENCOUNTER — Ambulatory Visit (INDEPENDENT_AMBULATORY_CARE_PROVIDER_SITE_OTHER): Payer: Medicaid Other | Admitting: Family Medicine

## 2022-09-24 VITALS — BP 126/76 | HR 103 | Wt 186.0 lb

## 2022-09-24 DIAGNOSIS — Z3493 Encounter for supervision of normal pregnancy, unspecified, third trimester: Secondary | ICD-10-CM | POA: Diagnosis not present

## 2022-09-24 DIAGNOSIS — Z3A36 36 weeks gestation of pregnancy: Secondary | ICD-10-CM | POA: Insufficient documentation

## 2022-09-24 DIAGNOSIS — Z3483 Encounter for supervision of other normal pregnancy, third trimester: Secondary | ICD-10-CM | POA: Insufficient documentation

## 2022-09-24 DIAGNOSIS — Z3480 Encounter for supervision of other normal pregnancy, unspecified trimester: Secondary | ICD-10-CM

## 2022-09-24 NOTE — Progress Notes (Signed)
CC: ROB  Pelvic pain and pressure, braxton hicks

## 2022-09-24 NOTE — Progress Notes (Signed)
   PRENATAL VISIT NOTE  Subjective:  Marisa Pearson is a 27 y.o. G3P2002 at [redacted]w[redacted]d being seen today for ongoing prenatal care.  She is currently monitored for the following issues for this low-risk pregnancy and has Supervision of other normal pregnancy, antepartum; Sickle cell trait (HCC); and Community acquired pneumonia on their problem list.  Patient reports no complaints.  Contractions: Irregular. Vag. Bleeding: None.  Movement: Present. Denies leaking of fluid.   The following portions of the patient's history were reviewed and updated as appropriate: allergies, current medications, past family history, past medical history, past social history, past surgical history and problem list.   Objective:   Vitals:   09/24/22 1012  BP: 126/76  Pulse: (!) 103  Weight: 186 lb (84.4 kg)    Fetal Status:   Fundal Height: 36 cm Movement: Present  Presentation: Vertex  General:  Alert, oriented and cooperative. Patient is in no acute distress.  Skin: Skin is warm and dry. No rash noted.   Cardiovascular: Normal heart rate noted  Respiratory: Normal respiratory effort, no problems with respiration noted  Abdomen: Soft, gravid, appropriate for gestational age.  Pain/Pressure: Present     Pelvic: Cervical exam performed in the presence of a chaperone Dilation: 1 Effacement (%): Thick Station: Ballotable  Extremities: Normal range of motion.  Edema: None  Mental Status: Normal mood and affect. Normal behavior. Normal judgment and thought content.   Assessment and Plan:  Pregnancy: G3P2002 at [redacted]w[redacted]d 1. Supervision of other normal pregnancy, antepartum Up to date Awaiting labor Vigorous fetal movement Patient's mother is here from [place]! Patient asked about elective IOL at 40wks and we will discuss further at her 39 week visit with me.   2. [redacted] weeks gestation of pregnancy - Cervicovaginal ancillary only - Strep Gp B NAA  Preterm labor symptoms and general obstetric precautions  including but not limited to vaginal bleeding, contractions, leaking of fluid and fetal movement were reviewed in detail with the patient. Please refer to After Visit Summary for other counseling recommendations.   Return in about 1 week (around 10/01/2022) for Routine prenatal care.  Future Appointments  Date Time Provider Department Center  10/01/2022  9:35 AM [place] Bing, MD [facility]  10/08/2022  9:35 AM Reva Bores, MD [facility]  10/15/2022 10:15 AM Federico Flake, MD [facility]    Federico Flake, MD

## 2022-09-24 NOTE — Patient Instructions (Addendum)
[email or web address]          Dental Resources   [place]   Dr. Remer Macho  Exam $85   [address]  Extraction $120 and up   [employer], [place]  *full list of prices [phone]      [facility]  Exam [phone]   [address]  Exam w/ Xrays $380   [place], [place]  Xrays $65 and up   [phone]  Cleaning $101   Extraction $190 and up      [facility]  Cleaning + Xray $344   [address]  Extraction- pt has to be seen first to give price   [place], [place]   [phone]     [facility]   Dr. Romeo Apple Turner/Dr. Richrd Humbles  Exam, Cleaning, Xray $262   [address]  Extraction [phone]   [place]   [phone]      [facility]  Cleaning $5   [address] $5   [place], [place] [address]  Call to get on waiting list   [phone] ext [phone]     Dr. Fayrene Fearing [place]/Dr. Stephenie Acres   [address]  Xray $85 Each   [place], [place] [address]  Extraction [phone]    Dr. Hoover Browns  Extraction $300 per tooth   [address]   [place], [place] [address]   [phone]      Dr. Nuala Alpha  Cleaning $300   [address]  Extraction $273   [place], [place] [address]   [phone]     [facility]  Emergency Exam $65   [address]  Cleaning & Exam $150   [place], [place] [address]  Extractions: Simple $180 Surgical $250   [phone]  [place] [phone]    [email or web address]

## 2022-09-26 LAB — STREP GP B NAA: Strep Gp B NAA: NEGATIVE

## 2022-09-26 LAB — CERVICOVAGINAL ANCILLARY ONLY: Comment: NEGATIVE

## 2022-09-27 LAB — CERVICOVAGINAL ANCILLARY ONLY
Chlamydia: NEGATIVE
Comment: NORMAL
Neisseria Gonorrhea: NEGATIVE

## 2022-10-01 ENCOUNTER — Ambulatory Visit (INDEPENDENT_AMBULATORY_CARE_PROVIDER_SITE_OTHER): Payer: Medicaid Other | Admitting: Obstetrics and Gynecology

## 2022-10-01 VITALS — BP 116/77 | HR 91 | Wt 186.2 lb

## 2022-10-01 DIAGNOSIS — R9389 Abnormal findings on diagnostic imaging of other specified body structures: Secondary | ICD-10-CM | POA: Insufficient documentation

## 2022-10-01 DIAGNOSIS — Z3A37 37 weeks gestation of pregnancy: Secondary | ICD-10-CM

## 2022-10-01 NOTE — Progress Notes (Signed)
    PRENATAL VISIT NOTE  Subjective:  Marisa Pearson is a 27 y.o. G3P2002 at [redacted]w[redacted]d being seen today for ongoing prenatal care.  She is currently monitored for the following issues for this low-risk pregnancy and has Supervision of other normal pregnancy, antepartum; Sickle cell trait (HCC); Community acquired pneumonia; and Abnormal chest x-ray on their problem list.  Patient reports no complaints.  Contractions: Not present. Vag. Bleeding: None.  Movement: Present. Denies leaking of fluid.   The following portions of the patient's history were reviewed and updated as appropriate: allergies, current medications, past family history, past medical history, past social history, past surgical history and problem list.   Objective:   Vitals:   10/01/22 0948  BP: 116/77  Pulse: 91  Weight: 186 lb 3.2 oz (84.5 kg)    Fetal Status: Fetal Heart Rate (bpm): 153 Fundal Height: 37 cm Movement: Present  Presentation: Vertex  General:  Alert, oriented and cooperative. Patient is in no acute distress.  Skin: Skin is warm and dry. No rash noted.   Cardiovascular: Normal heart rate noted  Respiratory: Normal respiratory effort, no problems with respiration noted  Abdomen: Soft, gravid, appropriate for gestational age.  Pain/Pressure: Present     Pelvic: Cervical exam deferred        Extremities: Normal range of motion.  Edema: None  Mental Status: Normal mood and affect. Normal behavior. Normal judgment and thought content.   Assessment and Plan:  Pregnancy: G3P2002 at [redacted]w[redacted]d 1. Abnormal chest x-ray Needs PP CXR; can do prior to discharge home  2. [redacted] weeks gestation of pregnancy Desires IOL. I d/w her re: process and pt amenable to this. Can set up next visit for 39-40wks GBS neg Talk more with patient re: birth control options next visit.   Preterm labor symptoms and general obstetric precautions including but not limited to vaginal bleeding, contractions, leaking of fluid and fetal  movement were reviewed in detail with the patient. Please refer to After Visit Summary for other counseling recommendations.   No follow-ups on file.  Future Appointments  Date Time Provider Department Center  10/08/2022  9:35 AM Reva Bores, MD [facility]  10/15/2022 10:15 AM Federico Flake, MD [facility]    [place] Bing, MD

## 2022-10-01 NOTE — Progress Notes (Signed)
CC: ROB  37 week 2 day

## 2022-10-08 ENCOUNTER — Ambulatory Visit (INDEPENDENT_AMBULATORY_CARE_PROVIDER_SITE_OTHER): Payer: Medicaid Other | Admitting: Family Medicine

## 2022-10-08 VITALS — BP 118/80 | HR 98 | Wt 190.0 lb

## 2022-10-08 DIAGNOSIS — Z3480 Encounter for supervision of other normal pregnancy, unspecified trimester: Secondary | ICD-10-CM

## 2022-10-08 NOTE — Progress Notes (Signed)
    PRENATAL VISIT NOTE  Subjective:  Marisa Pearson is a 27 y.o. G3P2002 at [redacted]w[redacted]d being seen today for ongoing prenatal care.  She is currently monitored for the following issues for this low-risk pregnancy and has Supervision of other normal pregnancy, antepartum; Sickle cell trait (HCC); Community acquired pneumonia; and Abnormal chest x-ray on their problem list.  Patient reports no complaints.  Contractions: Not present. Vag. Bleeding: None.  Movement: Present. Denies leaking of fluid.   The following portions of the patient's history were reviewed and updated as appropriate: allergies, current medications, past family history, past medical history, past social history, past surgical history and problem list.   Objective:   Vitals:   10/08/22 0951  BP: 118/80  Pulse: 98  Weight: 190 lb (86.2 kg)    Fetal Status: Fetal Heart Rate (bpm): 140   Movement: Present     General:  Alert, oriented and cooperative. Patient is in no acute distress.  Skin: Skin is warm and dry. No rash noted.   Cardiovascular: Normal heart rate noted  Respiratory: Normal respiratory effort, no problems with respiration noted  Abdomen: Soft, gravid, appropriate for gestational age.  Pain/Pressure: Present     Pelvic: Cervical exam deferred        Extremities: Normal range of motion.  Edema: Trace  Mental Status: Normal mood and affect. Normal behavior. Normal judgment and thought content.   Assessment and Plan:  Pregnancy: G3P2002 at [redacted]w[redacted]d 1. Supervision of other normal pregnancy, antepartum Continue routine prenatal care. For IOL elective @ 39+ weeks per pt. Request. Usual discomforts of pregnancy. Understands she will not be called unless there is room. IOL scheduled for 41 weeks.  Preterm labor symptoms and general obstetric precautions including but not limited to vaginal bleeding, contractions, leaking of fluid and fetal movement were reviewed in detail with the patient. Please refer to  After Visit Summary for other counseling recommendations.   Return in 1 week (on 10/15/2022).  Future Appointments  Date Time Provider Department Center  10/15/2022 10:15 AM Federico Flake, MD [facility]  10/29/2022  6:30 AM [facility] [place] [facility] ROOM [facility] None    Reva Bores, MD

## 2022-10-08 NOTE — Progress Notes (Signed)
ROB

## 2022-10-15 ENCOUNTER — Ambulatory Visit (INDEPENDENT_AMBULATORY_CARE_PROVIDER_SITE_OTHER): Payer: Medicaid Other | Admitting: Family Medicine

## 2022-10-15 VITALS — BP 114/77 | HR 82 | Wt 191.0 lb

## 2022-10-15 DIAGNOSIS — Z3480 Encounter for supervision of other normal pregnancy, unspecified trimester: Secondary | ICD-10-CM

## 2022-10-15 NOTE — Progress Notes (Signed)
ROB   Pt wants cervix check   Notes pain and pressure and Braxton hicks ctx's.  Pt would like to discuss induction date.  *Elective induction scheduled for 10/21/22.

## 2022-10-15 NOTE — Progress Notes (Signed)
   PRENATAL VISIT NOTE  Subjective:  Marisa Pearson is a 27 y.o. G3P2002 at [redacted]w[redacted]d being seen today for ongoing prenatal care.  She is currently monitored for the following issues for this low-risk pregnancy and has Supervision of other normal pregnancy, antepartum; Sickle cell trait (HCC); Community acquired pneumonia; and Abnormal chest x-ray on their problem list.  Patient reports no complaints- she reports she thinks the baby is no longer head down. She reports her belly shaped changed and she is not feeling pelvic pressure.  Contractions: Irritability. Vag. Bleeding: None.  Movement: Present. Denies leaking of fluid.   The following portions of the patient's history were reviewed and updated as appropriate: allergies, current medications, past family history, past medical history, past social history, past surgical history and problem list.   Objective:   Vitals:   10/15/22 1016  BP: 114/77  Pulse: 82  Weight: 191 lb (86.6 kg)    Fetal Status: Fetal Heart Rate (bpm): 152 Fundal Height: 39 cm Movement: Present     General:  Alert, oriented and cooperative. Patient is in no acute distress.  Skin: Skin is warm and dry. No rash noted.   Cardiovascular: Normal heart rate noted  Respiratory: Normal respiratory effort, no problems with respiration noted  Abdomen: Soft, gravid, appropriate for gestational age.  Pain/Pressure: Present     Pelvic: Cervical exam performed in the presence of a chaperone-- unable to feel fetal head and cervix is VERY posterior today which is definite change.  She went pee and it improved-- 3cm external os but FT internal os        Extremities: Normal range of motion.  Edema: Trace  Mental Status: Normal mood and affect. Normal behavior. Normal judgment and thought content.   Assessment and Plan:  Pregnancy: G3P2002 at [redacted]w[redacted]d  1. Supervision of other normal pregnancy, antepartum FH appropriate Scheduled for IOL at 41 weeks, confirmed with LD today  that patient will be on the elective list starting on 7/18.  Discussed going to MAU for labor Confirmed vertex on [place] today   Preterm labor symptoms and general obstetric precautions including but not limited to vaginal bleeding, contractions, leaking of fluid and fetal movement were reviewed in detail with the patient. Please refer to After Visit Summary for other counseling recommendations.   Return in about 1 week (around 10/22/2022) for Routine prenatal care.  Future Appointments  Date Time Provider Department Center  10/21/2022  3:30 PM [place] Bing, MD [facility]  10/29/2022  6:30 AM [facility] SCHED ROOM [facility] None    Federico Flake, MD

## 2022-10-17 ENCOUNTER — Encounter (HOSPITAL_COMMUNITY): Payer: Self-pay | Admitting: Obstetrics & Gynecology

## 2022-10-17 ENCOUNTER — Other Ambulatory Visit: Payer: Self-pay

## 2022-10-17 ENCOUNTER — Inpatient Hospital Stay (HOSPITAL_COMMUNITY)
Admission: AD | Admit: 2022-10-17 | Discharge: 2022-10-17 | Disposition: A | Discharge status: Home / Self Care | Payer: Medicaid Other | Attending: Obstetrics & Gynecology | Admitting: Obstetrics & Gynecology

## 2022-10-17 DIAGNOSIS — O26893 Other specified pregnancy related conditions, third trimester: Secondary | ICD-10-CM | POA: Insufficient documentation

## 2022-10-17 DIAGNOSIS — Z3A39 39 weeks gestation of pregnancy: Secondary | ICD-10-CM | POA: Diagnosis not present

## 2022-10-17 DIAGNOSIS — R1030 Lower abdominal pain, unspecified: Secondary | ICD-10-CM | POA: Insufficient documentation

## 2022-10-17 DIAGNOSIS — O2343 Unspecified infection of urinary tract in pregnancy, third trimester: Secondary | ICD-10-CM | POA: Diagnosis not present

## 2022-10-17 DIAGNOSIS — O471 False labor at or after 37 completed weeks of gestation: Secondary | ICD-10-CM | POA: Diagnosis not present

## 2022-10-17 DIAGNOSIS — B9689 Other specified bacterial agents as the cause of diseases classified elsewhere: Secondary | ICD-10-CM | POA: Diagnosis not present

## 2022-10-17 LAB — URINALYSIS, ROUTINE W REFLEX MICROSCOPIC
Bilirubin Urine: NEGATIVE
Glucose, UA: NEGATIVE mg/dL
Ketones, ur: NEGATIVE mg/dL
Nitrite: NEGATIVE
Protein, ur: NEGATIVE mg/dL
Specific Gravity, Urine: 1.005 (ref 1.005–1.030)
pH: 7 (ref 5.0–8.0)

## 2022-10-17 MED ORDER — CEFADROXIL 500 MG PO CAPS: 500.0000 mg | ORAL_CAPSULE | Freq: Two times a day (BID) | ORAL | 0 refills | Status: DC

## 2022-10-17 NOTE — MAU Note (Signed)
Marisa Pearson is a 27 y.o. at [redacted]w[redacted]d here in MAU reporting: she's having ctxs that began yesterday, but have since tapered off and spaced out, greater than 15 minutes apart. Also reports she's having constant pain in the lower abdomen that's similar to "period pain."  Denies LOF, has spotting of dark red VB.  Endorses +FM. LMP: NA Onset of complaint: yesterday Pain score: 9 Vitals:   10/17/22 1322  BP: 123/67  Pulse: 94  Resp: 18  Temp: 98.1 F (36.7 C)  SpO2: 99%     FHT:157 bpm Lab orders placed from triage:   None

## 2022-10-17 NOTE — MAU Provider Note (Signed)
Chief Complaint:  Contractions   Event Date/Time   First Provider Initiated Contact with Patient 10/17/22 1358      HPI: Marisa Pearson is a 27 y.o. G3P2002 at [redacted]w[redacted]d by early ultrasound who presents to maternity admissions reporting constant lower abdominal pain, feeling like menstrual cramps. There are also irregular contractions, but the constant abdominal pain is why she came to MAU.  Also, she received a call to come in for her elective induction on 10/15/22 but missed the call and did not check the message until this morning.   She reports good fetal movement, denies LOF, or vaginal bleeding.    HPI  Past Medical History: Past Medical History:  Diagnosis Date   Medical history non-contributory    Ovarian cyst affecting pregnancy, antepartum     Past obstetric history: OB History  Gravida Para Term Preterm AB Living  3 2 2     2   SAB IAB Ectopic Multiple Live Births        0 2    # Outcome Date GA Lbr Len/2nd Weight Sex Type Anes PTL Lv  3 Current           2 Term 05/19/19 [redacted]w[redacted]d 03:34 / 00:05 2963 g M Vag-Spont EPI  LIV  1 Term 02/04/17 [redacted]w[redacted]d 17:04 / 00:32 2974 g M Vag-Spont EPI  LIV    Past Surgical History: Past Surgical History:  Procedure Laterality Date   EXCISION OF BREAST BIOPSY Bilateral 11/28/2018   Procedure: EXCISION OF BILATERAL AXILLARY ACCESSORY BREAST TISSUE;  Surgeon: Manus Rudd, MD;  Location: [facility] OR;  Service: General;  Laterality: Bilateral;    Family History: Family History  Problem Relation Age of Onset   Hypertension Maternal Grandmother    Hypertension Maternal Grandfather     Social History: Social History   Tobacco Use   Smoking status: Never   Smokeless tobacco: Never  Vaping Use   Vaping status: Never Used  Substance Use Topics   Alcohol use: No   Drug use: No    Allergies:  Allergies  Allergen Reactions   Peanut-Containing Drug Products Swelling    SWELLING REACTION UNSPECIFIED    Pork-Derived Products Other  (See Comments)    Muslim, no pork    Meds:  Medications Prior to Admission  Medication Sig Dispense Refill Last Dose   aspirin EC 81 MG tablet Take 1 tablet (81 mg total) by mouth daily. Start taking when you are [redacted] weeks pregnant for rest of pregnancy for prevention of preeclampsia 300 tablet 2 10/16/2022   Prenatal Vit-Fe Fumarate-FA (PRENATAL MULTIVITAMIN) TABS tablet Take 1 tablet by mouth daily at 12 noon.   10/17/2022   azithromycin (ZITHROMAX Z-PAK) 250 MG tablet 2 tablets first day then one tablet daily (Patient not taking: Reported on 08/27/2022) 6 each 0    benzonatate (TESSALON PERLES) 100 MG capsule Take 2 capsules (200 mg total) by mouth 3 (three) times daily as needed for cough. (Patient not taking: Reported on 08/27/2022) 20 capsule 0    chlorpheniramine-HYDROcodone (TUSSIONEX) 10-8 MG/5ML Take 5 mLs by mouth every 12 (twelve) hours as needed for cough. (Patient not taking: Reported on 08/27/2022) 70 mL 0    guaiFENesin (MUCINEX) 600 MG 12 hr tablet Take 1 tablet (600 mg total) by mouth every 12 (twelve) hours as needed for cough. (Patient not taking: Reported on 08/27/2022) 30 tablet 0    nystatin (MYCOSTATIN/NYSTOP) powder Apply 1 Application topically 3 (three) times daily. (Patient not taking: Reported on 08/27/2022)  15 g 0    nystatin cream (MYCOSTATIN) Apply 1 application topically 2 (two) times daily. (Patient not taking: Reported on 08/27/2022) 30 g 1    ondansetron (ZOFRAN-ODT) 4 MG disintegrating tablet Take 1 tablet (4 mg total) by mouth every 6 (six) hours as needed for nausea. (Patient not taking: Reported on 08/27/2022) 20 tablet 0     ROS:  Review of Systems  Constitutional:  Negative for chills, fatigue and fever.  Eyes:  Negative for visual disturbance.  Respiratory:  Negative for shortness of breath.   Cardiovascular:  Negative for chest pain.  Gastrointestinal:  Positive for abdominal pain. Negative for nausea and vomiting.  Genitourinary:  Positive for pelvic pain.  Negative for difficulty urinating, dysuria, flank pain, vaginal bleeding, vaginal discharge and vaginal pain.  Musculoskeletal:  Negative for back pain.  Neurological:  Negative for dizziness and headaches.  Psychiatric/Behavioral: Negative.       I have reviewed patient's Past Medical Hx, Surgical Hx, Family Hx, Social Hx, medications and allergies.   Physical Exam  Patient Vitals for the past 24 hrs:  BP Temp Temp src Pulse Resp SpO2 Height Weight  10/17/22 1322 123/67 98.1 F (36.7 C) Oral 94 18 99 % -- --  10/17/22 1313 -- -- -- -- -- -- 5\' 6"  (1.676 m) 87.4 kg   Constitutional: Well-developed, well-nourished female in no acute distress.  Cardiovascular: normal rate Respiratory: normal effort GI: Abd soft, non-tender, gravid appropriate for gestational age.  MS: Extremities nontender, no edema, normal ROM Neurologic: Alert and oriented x 4.  GU: Neg CVAT.  PELVIC EXAM:   Dilation: Closed (internally) Cervical Position: Posterior Exam by:: L. Leftwich-Kirby CNM  FHT:  Baseline 135 , moderate variability, accelerations present, no decelerations Contractions: 5-10 minutes   Labs: Results for orders placed or performed during the hospital encounter of 10/17/22 (from the past 24 hour(s))  Urinalysis, Routine w reflex microscopic -Urine, Clean Catch     Status: Abnormal   Collection Time: 10/17/22  2:17 PM  Result Value Ref Range   Color, Urine STRAW (A) YELLOW   APPearance HAZY (A) CLEAR   Specific Gravity, Urine 1.005 1.005 - 1.030   pH 7.0 5.0 - 8.0   Glucose, UA NEGATIVE NEGATIVE mg/dL   Hgb urine dipstick SMALL (A) NEGATIVE   Bilirubin Urine NEGATIVE NEGATIVE   Ketones, ur NEGATIVE NEGATIVE mg/dL   Protein, ur NEGATIVE NEGATIVE mg/dL   Nitrite NEGATIVE NEGATIVE   Leukocytes,Ua MODERATE (A) NEGATIVE   RBC / HPF 0-5 0 - 5 RBC/hpf   WBC, UA 11-20 0 - 5 WBC/hpf   Bacteria, UA MANY (A) NONE SEEN   Squamous Epithelial / HPF 11-20 0 - 5 /HPF   Mucus PRESENT     O/Positive/-- (12/29 1210)  Imaging:  No results found.  MAU Course/MDM: Orders Placed This Encounter  Procedures   Urinalysis, Routine w reflex microscopic -Urine, Clean Catch   Discharge patient    Meds ordered this encounter  Medications   cefadroxil (DURICEF) 500 MG capsule    Sig: Take 1 capsule (500 mg total) by mouth 2 (two) times daily for 7 days.    Dispense:  14 capsule    Refill:  0    Order Specific Question:   Supervising Provider    Answer:   Jaynie Collins A [[id number]]     NST reviewed and reactive No evidence of labor with closed/long cervix L&D unable to admit elective induction at this time, pt added to list for  10/18/22, pt aware that medical induction will be called to come in first Labor precautions reviewed Urine with moderate leukocytes and pt with symptoms c/w UTI Rx for Duricef BID x 7 days Pt added back to elective induction list Reviewed labor readiness with patient including the [employer] medical IOL scheduled 7/26    Assessment: 1. Urinary tract infection in mother during third trimester of pregnancy   2. [redacted] weeks gestation of pregnancy     Plan: Discharge home Labor precautions and fetal kick counts   Follow-up Information     [facility] for [facility] Follow up.   Specialty: Obstetrics and Gynecology Why: As scheduled Contact information: [address] [place] [address] [phone]        [facility] Follow up.   Specialty: Obstetrics and Gynecology Why: As needed for signs of labor or emergencies Contact information: [address] [id number] mc [place] [address] [phone]               Allergies as of 10/17/2022       Reactions   Peanut-containing Drug Products Swelling   SWELLING REACTION UNSPECIFIED    Pork-derived Products Other (See Comments)   Muslim, no pork        Medication List      STOP taking these medications    azithromycin 250 MG tablet Commonly known as: Zithromax Z-Pak   benzonatate 100 MG capsule Commonly known as: Tessalon Perles   chlorpheniramine-HYDROcodone 10-8 MG/5ML Commonly known as: TUSSIONEX   guaiFENesin 600 MG 12 hr tablet Commonly known as: Mucinex   nystatin cream Commonly known as: MYCOSTATIN   nystatin powder Commonly known as: MYCOSTATIN/NYSTOP   ondansetron 4 MG disintegrating tablet Commonly known as: ZOFRAN-ODT       TAKE these medications    aspirin EC 81 MG tablet Take 1 tablet (81 mg total) by mouth daily. Start taking when you are [redacted] weeks pregnant for rest of pregnancy for prevention of preeclampsia   cefadroxil 500 MG capsule Commonly known as: DURICEF Take 1 capsule (500 mg total) by mouth 2 (two) times daily for 7 days.   prenatal multivitamin Tabs tablet Take 1 tablet by mouth daily at 12 noon.        Sharen Counter Certified Nurse-Midwife 10/17/2022 3:54 PM

## 2022-10-17 NOTE — Discharge Instructions (Signed)
Reasons to return to MAU at [facility]:  1.  Contractions are  5 minutes apart or less, each last 1 minute, these have been going on for 1-2 hours, and you cannot walk or talk during them 2.  You have a large gush of fluid, or a trickle of fluid that will not stop and you have to wear a pad 3.  You have bleeding that is bright red, heavier than spotting--like menstrual bleeding (spotting can be normal in early labor or after a check of your cervix) 4.  You do not feel the baby moving like he/she normally does  

## 2022-10-19 ENCOUNTER — Inpatient Hospital Stay (HOSPITAL_COMMUNITY)
Admission: AD | Admit: 2022-10-19 | Discharge: 2022-10-21 | DRG: 805 | Disposition: A | Discharge status: Home / Self Care | Payer: Medicaid Other | Attending: Family Medicine | Admitting: Family Medicine

## 2022-10-19 ENCOUNTER — Encounter (HOSPITAL_COMMUNITY): Payer: Self-pay | Admitting: Family Medicine

## 2022-10-19 ENCOUNTER — Inpatient Hospital Stay (HOSPITAL_COMMUNITY): Payer: Medicaid Other | Admitting: Anesthesiology

## 2022-10-19 ENCOUNTER — Inpatient Hospital Stay (HOSPITAL_COMMUNITY): Payer: Medicaid Other

## 2022-10-19 DIAGNOSIS — J189 Pneumonia, unspecified organism: Secondary | ICD-10-CM | POA: Diagnosis not present

## 2022-10-19 DIAGNOSIS — Z3480 Encounter for supervision of other normal pregnancy, unspecified trimester: Secondary | ICD-10-CM

## 2022-10-19 DIAGNOSIS — O9081 Anemia of the puerperium: Secondary | ICD-10-CM | POA: Diagnosis not present

## 2022-10-19 DIAGNOSIS — D62 Acute posthemorrhagic anemia: Secondary | ICD-10-CM | POA: Diagnosis not present

## 2022-10-19 DIAGNOSIS — O99214 Obesity complicating childbirth: Secondary | ICD-10-CM | POA: Diagnosis not present

## 2022-10-19 DIAGNOSIS — Z20822 Contact with and (suspected) exposure to covid-19: Secondary | ICD-10-CM | POA: Diagnosis not present

## 2022-10-19 DIAGNOSIS — O9952 Diseases of the respiratory system complicating childbirth: Secondary | ICD-10-CM | POA: Diagnosis not present

## 2022-10-19 DIAGNOSIS — R062 Wheezing: Secondary | ICD-10-CM | POA: Diagnosis not present

## 2022-10-19 DIAGNOSIS — O26893 Other specified pregnancy related conditions, third trimester: Secondary | ICD-10-CM | POA: Diagnosis not present

## 2022-10-19 DIAGNOSIS — D573 Sickle-cell trait: Secondary | ICD-10-CM | POA: Diagnosis present

## 2022-10-19 DIAGNOSIS — Z3A39 39 weeks gestation of pregnancy: Secondary | ICD-10-CM

## 2022-10-19 DIAGNOSIS — Z3490 Encounter for supervision of normal pregnancy, unspecified, unspecified trimester: Secondary | ICD-10-CM

## 2022-10-19 LAB — CBC
HCT: 36.6 % (ref 36.0–46.0)
Hemoglobin: 12.1 g/dL (ref 12.0–15.0)
MCH: 26.1 pg (ref 26.0–34.0)
MCHC: 33.1 g/dL (ref 30.0–36.0)
MCV: 79 fL — ABNORMAL LOW (ref 80.0–100.0)
Platelets: 192 10*3/uL (ref 150–400)
RBC: 4.63 MIL/uL (ref 3.87–5.11)
RDW: 13.7 % (ref 11.5–15.5)
WBC: 9.9 10*3/uL (ref 4.0–10.5)
nRBC: 0 % (ref 0.0–0.2)

## 2022-10-19 LAB — TYPE AND SCREEN
ABO/RH(D): O POS
Antibody Screen: NEGATIVE

## 2022-10-19 LAB — SARS CORONAVIRUS 2 BY RT PCR: SARS Coronavirus 2 by RT PCR: NEGATIVE

## 2022-10-19 LAB — STREP PNEUMONIAE URINARY ANTIGEN: Strep Pneumo Urinary Antigen: NEGATIVE

## 2022-10-19 LAB — RPR: RPR Ser Ql: NONREACTIVE

## 2022-10-19 MED ORDER — OXYCODONE-ACETAMINOPHEN 5-325 MG PO TABS: 2.0000 | ORAL_TABLET | ORAL | Status: DC | PRN

## 2022-10-19 MED ORDER — PHENYLEPHRINE 80 MCG/ML (10ML) SYRINGE FOR IV PUSH (FOR BLOOD PRESSURE SUPPORT)
80.0000 ug | PREFILLED_SYRINGE | INTRAVENOUS | Status: DC | PRN
  Administered 2022-10-19: 80 ug via INTRAVENOUS
  Filled 2022-10-19: qty 10

## 2022-10-19 MED ORDER — SIMETHICONE 80 MG PO CHEW: 80.0000 mg | CHEWABLE_TABLET | ORAL | Status: DC | PRN

## 2022-10-19 MED ORDER — EPHEDRINE 5 MG/ML INJ: 10.0000 mg | INTRAVENOUS | Status: DC | PRN

## 2022-10-19 MED ORDER — DIPHENHYDRAMINE HCL 50 MG/ML IJ SOLN: 12.5000 mg | INTRAMUSCULAR | Status: DC | PRN

## 2022-10-19 MED ORDER — FLEET ENEMA 7-19 GM/118ML RE ENEM: 1.0000 | ENEMA | RECTAL | Status: DC | PRN

## 2022-10-19 MED ORDER — MISOPROSTOL 25 MCG QUARTER TABLET
25.0000 ug | ORAL_TABLET | Freq: Once | ORAL | Status: AC
  Administered 2022-10-19: 25 ug via VAGINAL
  Filled 2022-10-19: qty 1

## 2022-10-19 MED ORDER — SODIUM CHLORIDE 0.9% FLUSH: 3.0000 mL | INTRAVENOUS | Status: DC | PRN

## 2022-10-19 MED ORDER — ONDANSETRON HCL 4 MG/2ML IJ SOLN: 4.0000 mg | Freq: Four times a day (QID) | INTRAMUSCULAR | Status: DC | PRN

## 2022-10-19 MED ORDER — BENZOCAINE-MENTHOL 20-0.5 % EX AERO: 1.0000 | INHALATION_SPRAY | CUTANEOUS | Status: DC | PRN

## 2022-10-19 MED ORDER — ONDANSETRON HCL 4 MG PO TABS: 4.0000 mg | ORAL_TABLET | ORAL | Status: DC | PRN

## 2022-10-19 MED ORDER — ACETAMINOPHEN 325 MG PO TABS: 650.0000 mg | ORAL_TABLET | ORAL | Status: DC | PRN

## 2022-10-19 MED ORDER — SENNOSIDES-DOCUSATE SODIUM 8.6-50 MG PO TABS
2.0000 | ORAL_TABLET | ORAL | Status: DC
  Administered 2022-10-19 – 2022-10-21 (×2): 2 via ORAL
  Filled 2022-10-19 (×3): qty 2

## 2022-10-19 MED ORDER — SOD CITRATE-CITRIC ACID 500-334 MG/5ML PO SOLN: 30.0000 mL | ORAL | Status: DC | PRN

## 2022-10-19 MED ORDER — SODIUM CHLORIDE 0.9 % IV SOLN: 250.0000 mL | INTRAVENOUS | Status: DC | PRN

## 2022-10-19 MED ORDER — WITCH HAZEL-GLYCERIN EX PADS: 1.0000 | MEDICATED_PAD | CUTANEOUS | Status: DC | PRN

## 2022-10-19 MED ORDER — PRENATAL MULTIVITAMIN CH
1.0000 | ORAL_TABLET | Freq: Every day | ORAL | Status: DC
  Administered 2022-10-19 – 2022-10-21 (×3): 1 via ORAL
  Filled 2022-10-19 (×4): qty 1

## 2022-10-19 MED ORDER — IBUPROFEN 600 MG PO TABS
600.0000 mg | ORAL_TABLET | Freq: Four times a day (QID) | ORAL | Status: DC
  Administered 2022-10-19 – 2022-10-21 (×9): 600 mg via ORAL
  Filled 2022-10-19 (×9): qty 1

## 2022-10-19 MED ORDER — TETANUS-DIPHTH-ACELL PERTUSSIS 5-2.5-18.5 LF-MCG/0.5 IM SUSY: 0.5000 mL | PREFILLED_SYRINGE | Freq: Once | INTRAMUSCULAR | Status: DC

## 2022-10-19 MED ORDER — DIBUCAINE (PERIANAL) 1 % EX OINT: 1.0000 | TOPICAL_OINTMENT | CUTANEOUS | Status: DC | PRN

## 2022-10-19 MED ORDER — OXYCODONE-ACETAMINOPHEN 5-325 MG PO TABS: 1.0000 | ORAL_TABLET | ORAL | Status: DC | PRN

## 2022-10-19 MED ORDER — COCONUT OIL OIL: 1.0000 | TOPICAL_OIL | Status: DC | PRN

## 2022-10-19 MED ORDER — LACTATED RINGERS IV SOLN: 500.0000 mL | Freq: Once | INTRAVENOUS | Status: DC

## 2022-10-19 MED ORDER — SODIUM CHLORIDE 0.9% FLUSH: 3.0000 mL | Freq: Two times a day (BID) | INTRAVENOUS | Status: DC

## 2022-10-19 MED ORDER — ONDANSETRON HCL 4 MG/2ML IJ SOLN: 4.0000 mg | INTRAMUSCULAR | Status: DC | PRN

## 2022-10-19 MED ORDER — ACETAMINOPHEN 325 MG PO TABS
650.0000 mg | ORAL_TABLET | ORAL | Status: DC | PRN
  Administered 2022-10-21: 650 mg via ORAL
  Filled 2022-10-19: qty 2

## 2022-10-19 MED ORDER — LIDOCAINE HCL (PF) 1 % IJ SOLN: 30.0000 mL | INTRAMUSCULAR | Status: DC | PRN

## 2022-10-19 MED ORDER — MISOPROSTOL 50MCG HALF TABLET
50.0000 ug | ORAL_TABLET | Freq: Once | ORAL | Status: AC
  Administered 2022-10-19: 50 ug via ORAL
  Filled 2022-10-19: qty 1

## 2022-10-19 MED ORDER — FENTANYL-BUPIVACAINE-NACL 0.5-0.125-0.9 MG/250ML-% EP SOLN
12.0000 mL/h | EPIDURAL | Status: DC | PRN
  Administered 2022-10-19: 12 mL/h via EPIDURAL

## 2022-10-19 MED ORDER — FENTANYL-BUPIVACAINE-NACL 0.5-0.125-0.9 MG/250ML-% EP SOLN
EPIDURAL | Status: AC
  Filled 2022-10-19: qty 250

## 2022-10-19 MED ORDER — OXYTOCIN BOLUS FROM INFUSION
333.0000 mL | Freq: Once | INTRAVENOUS | Status: AC
  Administered 2022-10-19: 333 mL via INTRAVENOUS

## 2022-10-19 MED ORDER — OXYTOCIN-SODIUM CHLORIDE 30-0.9 UT/500ML-% IV SOLN
2.5000 [IU]/h | INTRAVENOUS | Status: DC
  Filled 2022-10-19: qty 500

## 2022-10-19 MED ORDER — LACTATED RINGERS IV SOLN
500.0000 mL | INTRAVENOUS | Status: DC | PRN
  Administered 2022-10-19: 500 mL via INTRAVENOUS

## 2022-10-19 MED ORDER — GUAIFENESIN 200 MG PO TABS
200.0000 mg | ORAL_TABLET | Freq: Four times a day (QID) | ORAL | Status: DC
  Administered 2022-10-19 – 2022-10-20 (×5): 200 mg via ORAL
  Filled 2022-10-19 (×13): qty 1

## 2022-10-19 MED ORDER — PHENYLEPHRINE 80 MCG/ML (10ML) SYRINGE FOR IV PUSH (FOR BLOOD PRESSURE SUPPORT)
80.0000 ug | PREFILLED_SYRINGE | INTRAVENOUS | Status: DC | PRN
  Administered 2022-10-19: 80 ug via INTRAVENOUS

## 2022-10-19 MED ORDER — DIPHENHYDRAMINE HCL 25 MG PO CAPS: 25.0000 mg | ORAL_CAPSULE | Freq: Four times a day (QID) | ORAL | Status: DC | PRN

## 2022-10-19 MED ORDER — LACTATED RINGERS IV SOLN: INTRAVENOUS | Status: DC

## 2022-10-19 MED ORDER — LIDOCAINE HCL (PF) 1 % IJ SOLN
INTRAMUSCULAR | Status: DC | PRN
  Administered 2022-10-19 (×2): 5 mL via EPIDURAL

## 2022-10-19 MED ORDER — TERBUTALINE SULFATE 1 MG/ML IJ SOLN: 0.2500 mg | Freq: Once | INTRAMUSCULAR | Status: DC | PRN

## 2022-10-19 MED ORDER — ZOLPIDEM TARTRATE 5 MG PO TABS: 5.0000 mg | ORAL_TABLET | Freq: Every evening | ORAL | Status: DC | PRN

## 2022-10-19 NOTE — Progress Notes (Addendum)
Labor Progress Note Marisa Pearson is a 27 y.o. G3P2002 at [redacted]w[redacted]d presented for eIOL S: no acute concerns. Feeling better after epidural placement  O:  BP 92/66   Pulse 94   Temp 98.5 F (36.9 C) (Oral)   Resp 16   Ht 5\' 6"  (1.676 m)   Wt 86.7 kg   LMP  (LMP Unknown)   SpO2 100%   BMI 30.86 kg/m  EFM: 130/moderate/+accels  CVE: Dilation: 6 Effacement (%): 90 Station: -2, -3 Presentation: Vertex Exam by:: Marisa Master, RN  Pulm Exam: Mild inspiratory wheeze RLL. No wheezing, rales, rhonchi heard in LUL/LLL/RUL. No respiratory distress.   A&P: 27 y.o. [id number] [redacted]w[redacted]d here for eIOL #Labor: Progressing well. S/p epidural, AROM (clear), cyto 25/25 #Pain: epidural #FWB: cat 1 #GBS negative  #Cough: pt diagnosed with CAP 08/13/22 in MAU, provided with tessalon perles for cough at that time. Pt notes cough and sneezing since arriving to L&D. Will proceed with legionella, strep pneumo, and covid testing (pt did not have this done at MAU visit). Pt given 200mg  guaifenesin q6h  Marisa Everhart, DO 10:05 AM  __ GME ATTESTATION:  Evaluation and management procedures were performed by the [facility] Medicine Resident under my supervision. I was immediately available for direct supervision, assistance and direction throughout this encounter.  I also confirm that I have verified the information documented in the resident's note, and that I have also personally reperformed the pertinent components of the physical exam and all of the medical decision making activities.  I have also made any necessary editorial changes.  Marisa Hawk, DO OB Fellow, Faculty [facility], Center for [facility] 10/19/2022 3:43 PM

## 2022-10-19 NOTE — Anesthesia Preprocedure Evaluation (Signed)
Anesthesia Evaluation  Patient identified by MRN, date of birth, ID band Patient awake    Reviewed: Allergy & Precautions, Patient's Chart, lab work & pertinent test results  Airway Mallampati: II       Dental no notable dental hx.    Pulmonary pneumonia, resolved   Pulmonary exam normal breath sounds clear to auscultation       Cardiovascular negative cardio ROS Normal cardiovascular exam Rhythm:Regular Rate:Normal     Neuro/Psych negative neurological ROS  negative psych ROS   GI/Hepatic negative GI ROS, Neg liver ROS,,,  Endo/Other  Obese  Renal/GU negative Renal ROS  negative genitourinary   Musculoskeletal negative musculoskeletal ROS (+)    Abdominal  (+) + obese  Peds  Hematology negative hematology ROS (+)   Anesthesia Other Findings   Reproductive/Obstetrics (+) Pregnancy                             Anesthesia Physical Anesthesia Plan  ASA: 2  Anesthesia Plan: Epidural   Post-op Pain Management:    Induction:   PONV Risk Score and Plan:   Airway Management Planned: Natural Airway  Additional Equipment:   Intra-op Plan:   Post-operative Plan:   Informed Consent: I have reviewed the patients History and Physical, chart, labs and discussed the procedure including the risks, benefits and alternatives for the proposed anesthesia with the patient or authorized representative who has indicated his/her understanding and acceptance.       Plan Discussed with: Anesthesiologist  Anesthesia Plan Comments:        Anesthesia Quick Evaluation

## 2022-10-19 NOTE — Discharge Summary (Signed)
Postpartum Discharge Summary     Patient Name: Marisa Pearson DOB: [birth year] MRN: [id number]  Date of admission: 10/19/2022 Delivery date:10/19/2022 Delivering provider: Myrtie Hawk Date of discharge: 10/21/2022  Admitting diagnosis: Term pregnancy [Z34.90] Intrauterine pregnancy: [redacted]w[redacted]d     Secondary diagnosis:  Principal Problem:   Vaginal delivery Active Problems:   Supervision of other normal pregnancy, antepartum   Sickle cell trait (HCC)   Community acquired pneumonia   Term pregnancy  Additional problems: None    Discharge diagnosis: Term Pregnancy Delivered                                              Post partum procedures: CXR Augmentation: AROM and Cytotec Complications: None  Hospital course: Induction of Labor With Vaginal Delivery   27 y.o. yo G3P3003 at [redacted]w[redacted]d was admitted to the hospital 10/19/2022 for induction of labor.  Indication for induction: Elective.  Patient had an labor course complicated by cough, concerning for PNA. Work up started. Membrane Rupture Time/Date: 6:41 AM,10/19/2022  Delivery Method:Vaginal, Spontaneous Episiotomy: None Lacerations:  None Details of delivery can be found in separate delivery note.  Patient had a postpartum course complicated by cough. PNA labs showed negative labs, CXR was negative.  She had Cefadroxil prescribed x 7 days.   Patient is discharged home 10/21/22.  Newborn Data: Birth date:10/19/2022 Birth time:10:53 AM Gender:Female Living status:Living Apgars:9 ,9  Weight:3030 g  Magnesium Sulfate received: No BMZ received: No Rhophylac:N/A MMR:N/A T-DaP: No Flu: N/A Transfusion:No  Physical exam  Vitals:   10/20/22 0555 10/20/22 1548 10/20/22 2236 10/21/22 0630  BP: 103/69 107/64 114/64 106/61  Pulse: 79 87 96 80  Resp: 17 18 16 17   Temp: 97.6 F (36.4 C) 98.6 F (37 C) 97.7 F (36.5 C) (!) 97.5 F (36.4 C)  TempSrc: Oral Oral Oral Oral  SpO2: 100% 100% 98% 99%  Weight:       Height:       General: alert, cooperative, and no distress Lochia: appropriate Uterine Fundus: firm Incision: N/A DVT Evaluation: No evidence of DVT seen on physical exam.  Negative Homan's sign. No cords or calf tenderness. No significant calf/ankle edema. Labs: Lab Results  Component Value Date   WBC 9.2 10/20/2022   HGB 9.4 (L) 10/20/2022   HCT 28.6 (L) 10/20/2022   MCV 79.2 (L) 10/20/2022   PLT 147 (L) 10/20/2022      Latest Ref Rng & Units 08/03/2022   11:35 PM  CMP  Glucose 70 - 99 mg/dL 86   BUN 6 - 20 mg/dL <5   Creatinine 0.86 - 1.00 mg/dL 5.78   Sodium 469 - 629 mmol/L 135   Potassium 3.5 - 5.1 mmol/L 3.4   Chloride 98 - 111 mmol/L 103   CO2 22 - 32 mmol/L 21   Calcium 8.9 - 10.3 mg/dL 8.9   Total Protein 6.5 - 8.1 g/dL 6.3   Total Bilirubin 0.3 - 1.2 mg/dL 0.3   Alkaline Phos 38 - 126 U/L 61   AST 15 - 41 U/L 15   ALT 0 - 44 U/L 16    Edinburgh Score:    05/19/2019    8:30 AM  Edinburgh Postnatal Depression Scale Screening Tool  I have been able to laugh and see the funny side of things. --     After visit meds:  Allergies  as of 10/21/2022       Reactions   Peanut-containing Drug Products Swelling   SWELLING REACTION UNSPECIFIED    Pork-derived Products Other (See Comments)   Muslim, no pork        Medication List     STOP taking these medications    aspirin EC 81 MG tablet       TAKE these medications    acetaminophen 500 MG tablet Commonly known as: TYLENOL Take 2 tablets (1,000 mg total) by mouth every 6 (six) hours as needed for moderate pain, fever or headache.   cefadroxil 500 MG capsule Commonly known as: DURICEF Take 1 capsule (500 mg total) by mouth 2 (two) times daily for 3 days.   ferrous sulfate 325 (65 FE) MG tablet Take 1 tablet (325 mg total) by mouth every other day. Start taking on: October 22, 2022   ibuprofen 600 MG tablet Commonly known as: ADVIL Take 1 tablet (600 mg total) by mouth every 6 (six) hours as  needed for headache, mild pain or moderate pain.   prenatal multivitamin Tabs tablet Take 1 tablet by mouth daily at 12 noon.   senna-docusate 8.6-50 MG tablet Commonly known as: Senokot-S Take 2 tablets by mouth 2 (two) times daily as needed for mild constipation.         Discharge home in stable condition Infant Feeding: Bottle and Breast Infant Disposition:home with mother Discharge instruction: per After Visit Summary and Postpartum booklet. Activity: Advance as tolerated. Pelvic rest for 6 weeks.  Diet: routine diet Future Appointments: Future Appointments  Date Time Provider Department Center  11/29/2022  3:10 PM Federico Flake, MD [facility]   Follow up Visit: Message sent to [facility] 7/26  Please schedule this patient for a In person postpartum visit in 6 weeks with the following provider: Any provider. Additional Postpartum F/U: n/a   High risk pregnancy complicated by:  SCT Delivery mode:  Vaginal, Spontaneous Anticipated Birth Control:   phexxi   10/21/2022 Jaynie Collins, MD

## 2022-10-19 NOTE — Anesthesia Procedure Notes (Signed)
Epidural Patient location during procedure: OB Start time: 10/19/2022 7:15 AM End time: 10/19/2022 7:23 AM  Staffing Anesthesiologist: Mal Amabile, MD Performed: anesthesiologist   Preanesthetic Checklist Completed: patient identified, IV checked, site marked, risks and benefits discussed, surgical consent, monitors and equipment checked, pre-op evaluation and timeout performed  Epidural Patient position: sitting Prep: DuraPrep and site prepped and draped Patient monitoring: continuous pulse ox and blood pressure Approach: midline Location: L3-L4 Injection technique: LOR air  Needle:  Needle type: Tuohy  Needle gauge: 17 G Needle length: 9 cm and 9 Needle insertion depth: 7 cm Catheter type: closed end flexible Catheter size: 19 Gauge Catheter at skin depth: 12 cm Test dose: negative and Other  Assessment Events: blood not aspirated, no cerebrospinal fluid, injection not painful, no injection resistance, no paresthesia and negative IV test  Additional Notes Patient identified. Risks and benefits discussed including failed block, incomplete  Pain control, post dural puncture headache, nerve damage, paralysis, blood pressure Changes, nausea, vomiting, reactions to medications-both toxic and allergic and post Partum back pain. All questions were answered. Patient expressed understanding and wished to proceed. Sterile technique was used throughout procedure. Epidural site was Dressed with sterile barrier dressing. No paresthesias, signs of intravascular injection Or signs of intrathecal spread were encountered. Attempt x 2. Difficult due to poor positioning. Patient was more comfortable after the epidural was dosed. Please see RN's note for documentation of vital signs and FHR which are stable. Reason for block:procedure for pain

## 2022-10-19 NOTE — H&P (Signed)
OBSTETRIC ADMISSION HISTORY AND PHYSICAL  Marisa Pearson is a 27 y.o. female G3P2002 with IUP at [redacted]w[redacted]d by 6 wk [place] presenting for eIOL. She reports +FMs, No LOF, no VB, no blurry vision, headaches or peripheral edema, and RUQ pain.  She plans on breast and bottle feeding. She request phexxi for birth control. She received her prenatal care at  [facility]    Dating: By 6 wk [place] --->  Estimated Date of Delivery: 10/20/22  Sono:    @[redacted]w[redacted]d , CWD, normal anatomy, cephalic presentation, posterior fundal placenta lie, 986g, 30% EFW   Prenatal History/Complications:  -SCT -Hx of CAP  Past Medical History: Past Medical History:  Diagnosis Date   Medical history non-contributory    Ovarian cyst affecting pregnancy, antepartum     Past Surgical History: Past Surgical History:  Procedure Laterality Date   EXCISION OF BREAST BIOPSY Bilateral 11/28/2018   Procedure: EXCISION OF BILATERAL AXILLARY ACCESSORY BREAST TISSUE;  Surgeon: Manus Rudd, MD;  Location: [facility] OR;  Service: General;  Laterality: Bilateral;    Obstetrical History: OB History     Gravida  3   Para  2   Term  2   Preterm      AB      Living  2      SAB      IAB      Ectopic      Multiple  0   Live Births  2           Social History Social History   Socioeconomic History   Marital status: Married    Spouse name: Not on file   Number of children: Not on file   Years of education: Not on file   Highest education level: Not on file  Occupational History   Not on file  Tobacco Use   Smoking status: Never   Smokeless tobacco: Never  Vaping Use   Vaping status: Never Used  Substance and Sexual Activity   Alcohol use: No   Drug use: No   Sexual activity: Yes  Other Topics Concern   Not on file  Social History Narrative   Not on file   Social Determinants of Health   Financial Resource Strain: Not on file  Food Insecurity: No Food Insecurity (10/19/2022)   Hunger Vital Sign    Worried  About Running Out of Food in the Last Year: Never true    Ran Out of Food in the Last Year: Never true  Transportation Needs: No Transportation Needs (10/19/2022)   PRAPARE - Administrator, Civil Service (Medical): No    Lack of Transportation (Non-Medical): No  Physical Activity: Not on file  Stress: Not on file  Social Connections: Not on file    Family History: Family History  Problem Relation Age of Onset   Hypertension Maternal Grandmother    Hypertension Maternal Grandfather     Allergies: Allergies  Allergen Reactions   Peanut-Containing Drug Products Swelling    SWELLING REACTION UNSPECIFIED    Pork-Derived Products Other (See Comments)    Muslim, no pork    Medications Prior to Admission  Medication Sig Dispense Refill Last Dose   aspirin EC 81 MG tablet Take 1 tablet (81 mg total) by mouth daily. Start taking when you are [redacted] weeks pregnant for rest of pregnancy for prevention of preeclampsia 300 tablet 2    cefadroxil (DURICEF) 500 MG capsule Take 1 capsule (500 mg total) by mouth 2 (two)  times daily for 7 days. 14 capsule 0    Prenatal Vit-Fe Fumarate-FA (PRENATAL MULTIVITAMIN) TABS tablet Take 1 tablet by mouth daily at 12 noon.        Review of Systems   All systems reviewed and negative except as stated in HPI  Temperature 97.6 F (36.4 C), temperature source Axillary, unknown if currently breastfeeding. General appearance: alert and no distress Lungs: normal effort Abdomen: gravid Extremities: No LE edema Presentation: cephalic per RN exam Fetal monitoringBaseline: 130-145 bpm, Variability: Good {> 6 bpm), Accelerations: Reactive, and Decelerations: Variable: mild Uterine activity every 10 mins Dilation: 1 Effacement (%): Thick Exam by:: Esther Hardy RN   Prenatal labs: ABO, Rh: O/Positive/-- (12/29 1210) Antibody: Negative (12/29 1210) Rubella: 15.30 (12/29 1210) RPR: Non Reactive (04/22 1128)  HBsAg: Negative (12/29 1210)  HIV: Non  Reactive (04/22 1128)  GBS: Negative/-- (06/21 1111)  1 hr Glucola 78 Genetic screening  LR, neg Anatomy US wnl  Prenatal Transfer Tool  Maternal Diabetes: No Genetic Screening: Normal Maternal Ultrasounds/Referrals: Normal Fetal Ultrasounds or other Referrals:  None Maternal Substance Abuse:  No Significant Maternal Medications:  None Significant Maternal Lab Results:  Group B Strep negative Number of Prenatal Visits:greater than 3 verified prenatal visits Other Comments:  None  Results for orders placed or performed during the hospital encounter of 10/19/22 (from the past 24 hour(s))  CBC   Collection Time: 10/19/22  1:14 AM  Result Value Ref Range   WBC 9.9 4.0 - 10.5 K/uL   RBC 4.63 3.87 - 5.11 MIL/uL   Hemoglobin 12.1 12.0 - 15.0 g/dL   HCT 54.0 98.1 - 19.1 %   MCV 79.0 (L) 80.0 - 100.0 fL   MCH 26.1 26.0 - 34.0 pg   MCHC 33.1 30.0 - 36.0 g/dL   RDW 47.8 29.5 - 62.1 %   Platelets 192 150 - 400 K/uL   nRBC 0.0 0.0 - 0.2 %    Patient Active Problem List   Diagnosis Date Noted   Term pregnancy 10/19/2022   Abnormal chest x-ray 10/01/2022   Community acquired pneumonia 08/04/2022   Sickle cell trait (HCC) 04/13/2022   Supervision of other normal pregnancy, antepartum 03/03/2022    Assessment/Plan:  Marisa Pearson is a 27 y.o. G3P2002 at [redacted]w[redacted]d here for eIOL.   #Labor: Cytotec 50/25 #Pain: Maternally supported #FWB: Cat I, with periods of Cat II due to intermittent variable. Overall reassuring #ID:GBS NEG #MOF: Both #MOC: Unsure, phexxi #Circ: yes  Marisa Maselli Autry-Lott, DO  10/19/2022, 2:56 AM

## 2022-10-19 NOTE — Progress Notes (Signed)
Labor Progress Note Marisa Pearson Annetta Maw is a 27 y.o. G3P2002 at [redacted]w[redacted]d presented for eIOL  S: No acute concerns. Having discomfort with contractions.   O:  BP 92/66   Pulse 94   Temp 98.5 F (36.9 C) (Oral)   Resp 16   Ht 5\' 6"  (1.676 m)   Wt 86.7 kg   LMP  (LMP Unknown)   SpO2 100%   BMI 30.86 kg/m  EFM: 125bpm/moderate/+accels, occasional variable  CVE: Dilation: 5.5 Effacement (%): 70 Station: -2 Presentation: Vertex Exam by:: Algis Greenhouse   A&P: 27 y.o. Z6X0960 [redacted]w[redacted]d her for eIOL.  #Labor: Progressing well. S/p AROM, clear fluid. Consider pitocin.  #Pain: Planning for epidural #FWB: Cat I, with periods of Cat II but overall reassuring #GBS negative  Quintyn Dombek Autry-Lott, DO 9:27 AM

## 2022-10-20 LAB — CBC
HCT: 28.6 % — ABNORMAL LOW (ref 36.0–46.0)
Hemoglobin: 9.4 g/dL — ABNORMAL LOW (ref 12.0–15.0)
MCH: 26 pg (ref 26.0–34.0)
MCHC: 32.9 g/dL (ref 30.0–36.0)
MCV: 79.2 fL — ABNORMAL LOW (ref 80.0–100.0)
Platelets: 147 10*3/uL — ABNORMAL LOW (ref 150–400)
RBC: 3.61 MIL/uL — ABNORMAL LOW (ref 3.87–5.11)
RDW: 14.1 % (ref 11.5–15.5)
WBC: 9.2 10*3/uL (ref 4.0–10.5)
nRBC: 0 % (ref 0.0–0.2)

## 2022-10-20 MED ORDER — FERROUS SULFATE 325 (65 FE) MG PO TABS
325.0000 mg | ORAL_TABLET | ORAL | Status: DC
  Administered 2022-10-20: 325 mg via ORAL
  Filled 2022-10-20: qty 1

## 2022-10-20 NOTE — Progress Notes (Signed)
Late entry note due to patient care.  POSTPARTUM PROGRESS NOTE  Post Partum Day 1  Subjective:  Marisa Pearson is a 27 y.o. Z6X0960 s/p VD at [redacted]w[redacted]d.  She reports she is doing well. No acute events overnight. She denies any problems with ambulating, voiding or po intake. Denies nausea or vomiting.  Pain is well controlled.  Lochia is minimal.  Objective: Blood pressure 107/64, pulse 87, temperature 98.6 F (37 C), temperature source Oral, resp. rate 18, height 5\' 6"  (1.676 m), weight 86.7 kg, SpO2 100%, unknown if currently breastfeeding.  Physical Exam:  General: alert, cooperative and no distress Chest: no respiratory distress Heart:regular rate, distal pulses intact Abdomen: soft, nontender,  Uterine Fundus: firm, appropriately tender DVT Evaluation: No calf swelling or tenderness Extremities: no edema Skin: warm, dry  Recent Labs    10/19/22 0114 10/20/22 0636  HGB 12.1 9.4*  HCT 36.6 28.6*    Assessment/Plan: Marisa Pearson is a 27 y.o. G3P3003 s/p VD at [redacted]w[redacted]d   PPD#1 - Doing well  Routine postpartum care Mild acute blood loss anemia, clinically significant - start po iron every other day Contraception: phexxi Feeding: both Dispo: Plan for discharge tomorrow.   LOS: 1 day

## 2022-10-20 NOTE — Anesthesia Postprocedure Evaluation (Signed)
Anesthesia Post Note  Patient: Marisa Pearson  Procedure(s) Performed: AN AD HOC LABOR EPIDURAL     Patient location during evaluation: Mother Baby Anesthesia Type: Epidural Level of consciousness: oriented, awake and awake and alert Pain management: pain level controlled Vital Signs Assessment: post-procedure vital signs reviewed and stable Respiratory status: spontaneous breathing, respiratory function stable and nonlabored ventilation Cardiovascular status: stable Postop Assessment: no headache, adequate PO intake, able to ambulate, patient able to bend at knees and no apparent nausea or vomiting Anesthetic complications: no   No notable events documented.  Last Vitals:  Vitals:   10/20/22 0328 10/20/22 0555  BP: 118/69 103/69  Pulse:  79  Resp: 17 17  Temp: 36.7 C 36.4 C  SpO2: 99% 100%    Last Pain:  Vitals:   10/20/22 0555  TempSrc: Oral  PainSc:    Pain Goal:                Epidural/Spinal Function Cutaneous sensation: Normal sensation (10/20/22 0758), Patient able to flex knees: Yes (10/20/22 0758), Patient able to lift hips off bed: Yes (10/20/22 0758), Back pain beyond tenderness at insertion site: No (10/20/22 0758), Progressively worsening motor and/or sensory loss: No (10/20/22 0758), Bowel and/or bladder incontinence post epidural: No (10/20/22 0758)  Kamarri Fischetti

## 2022-10-20 NOTE — Lactation Note (Signed)
This note was copied from a baby's chart. Lactation Consultation Note Mom had requested DEBP earlier from RN because baby had no interest in BF. Mom was holding baby STS after bath. Mom wanted baby dressed. As LC working w/baby he is cueing. LC placed baby in football hold. Mom stated she was having a hard time getting him to open his mouth. Mom stated she had a hard time latching her 2 other children when they were first born because her nipples are so big. LC could get baby on the nipple but that is all he is on and was biting mom stated. Mom stated it is very painful. Unlatched baby mom stated no she couldn't stand that pain. It was also causing contractions as he was suckling. Mom stated that she might have to give bottle until baby's mouth gets bigger and then she can BF. Mom stated she will pump and give bottle until baby can latch better. Mom has used pump but not getting anything. Mom is giving formula until her milk comes in. Newborn feeding habits, STS, I&O, positioning, support reviewed. Mom encouraged to feed baby 8-12 times/24 hours and with feeding cues.  Encouraged mom to call for assistance or questions. Patient Name: Marisa Pearson [id number] Date: 10/20/2022 Age:17 hours Reason for consult: Initial assessment;Term   Maternal Data Has patient been taught Hand Expression?: Yes Does the patient have breastfeeding experience prior to this delivery?: Yes How long did the patient breastfeed?: 1 st child 1 yr, 2nd child 3 months  Feeding    LATCH Score Latch: Repeated attempts needed to sustain latch, nipple held in mouth throughout feeding, stimulation needed to elicit sucking reflex.  Audible Swallowing: None  Type of Nipple: Everted at rest and after stimulation  Comfort (Breast/Nipple): Soft / non-tender  Hold (Positioning): Full assist, staff holds infant at breast  LATCH Score: 5   Lactation Tools Discussed/Used Tools: Pump Breast pump type:  Double-Electric Breast Pump Reason for Pumping: mom requested (RN set up)  Interventions Interventions: Breast feeding basics reviewed;Adjust position;DEBP;Assisted with latch;Support pillows;Skin to skin;Position options;Education;Breast massage;LC Services brochure;Breast compression  Discharge    Consult Status Consult Status: Follow-up Date: 10/20/22 Follow-up type: In-patient    Izzac Rockett, Diamond Nickel 10/20/2022, 2:25 AM

## 2022-10-21 ENCOUNTER — Encounter: Payer: Medicaid Other | Admitting: Obstetrics and Gynecology

## 2022-10-21 LAB — LEGIONELLA PNEUMOPHILA SEROGP 1 UR AG: L. pneumophila Serogp 1 Ur Ag: NEGATIVE

## 2022-10-21 MED ORDER — ACETAMINOPHEN 500 MG PO TABS: 1000.0000 mg | ORAL_TABLET | Freq: Four times a day (QID) | ORAL | Status: DC | PRN

## 2022-10-21 MED ORDER — FERROUS SULFATE 325 (65 FE) MG PO TABS: 325.0000 mg | ORAL_TABLET | ORAL | 3 refills | Status: DC

## 2022-10-21 MED ORDER — CEFADROXIL 500 MG PO CAPS: 500.0000 mg | ORAL_CAPSULE | Freq: Two times a day (BID) | ORAL | 0 refills | Status: AC

## 2022-10-21 MED ORDER — SENNOSIDES-DOCUSATE SODIUM 8.6-50 MG PO TABS: 2.0000 | ORAL_TABLET | Freq: Two times a day (BID) | ORAL | 2 refills | Status: DC | PRN

## 2022-10-21 MED ORDER — IBUPROFEN 600 MG PO TABS: 600.0000 mg | ORAL_TABLET | Freq: Four times a day (QID) | ORAL | 2 refills | Status: DC | PRN

## 2022-10-21 NOTE — Lactation Note (Signed)
This note was copied from a baby's chart. Lactation Consultation Note  Patient Name: Boy Gagandeep Kossman [id number] Date: 10/21/2022 Age:27 hours Reason for consult: Follow-up assessment;Infant weight loss;Term (5 % weight loss,) [place] om baby has been to the breast this morning.  LC reviewed the importance of prevention of engorgement and tx if needed.  LC encouraged mom to give the baby practice at the breast to bring her milk in .  LC reviewed BF D/C teaching and the [facility] resources.   Maternal Data    Feeding Mother's Current Feeding Choice: Breast Milk and Formula Nipple Type: Slow - flow   Lactation Tools Discussed/Used Tools: Pump Breast pump type: Double-Electric Breast Pump Pump Education: Milk Storage  Interventions Interventions: Breast feeding basics reviewed;DEBP;Education;LC Services brochure  Discharge Discharge Education: Engorgement and breast care;Warning signs for feeding baby Pump: Personal;DEBP WIC Program: Yes  Consult Status Consult Status: Complete Date: 10/21/22    Kathrin Greathouse 10/21/2022, 11:38 AM

## 2022-10-27 ENCOUNTER — Encounter: Payer: Self-pay | Admitting: Family Medicine

## 2022-10-27 ENCOUNTER — Ambulatory Visit (INDEPENDENT_AMBULATORY_CARE_PROVIDER_SITE_OTHER): Payer: Medicaid Other | Admitting: Family Medicine

## 2022-10-27 VITALS — BP 124/82 | HR 80 | Wt 182.0 lb

## 2022-10-27 DIAGNOSIS — R600 Localized edema: Secondary | ICD-10-CM | POA: Diagnosis not present

## 2022-10-27 DIAGNOSIS — G4489 Other headache syndrome: Secondary | ICD-10-CM

## 2022-10-27 MED ORDER — FUROSEMIDE 20 MG PO TABS: 20.0000 mg | ORAL_TABLET | Freq: Every day | ORAL | 0 refills | Status: DC

## 2022-10-27 NOTE — Progress Notes (Signed)
PP pt here for evaluation of pain and swelling in feet.also notes HA's Breastfeeding and bottle feeding with Formula.   Pt notes drinking water and elevating feet.taking Ibuprofen. Pt states she has never had swelling in feet after deliveries.  Pt delivered 10/19/22

## 2022-10-27 NOTE — Progress Notes (Signed)
    Subjective:    Patient ID: Marisa Pearson is a 27 y.o. female presenting with Edema  on 10/27/2022  HPI: 1 week pp and having headache. Improves with ibuprofen. Reports LE edema. Breast feeding going well.  Review of Systems  Constitutional:  Negative for chills and fever.  Respiratory:  Negative for shortness of breath.   Cardiovascular:  Negative for chest pain.  Gastrointestinal:  Negative for abdominal pain, nausea and vomiting.  Genitourinary:  Negative for dysuria.  Skin:  Negative for rash.      Objective:    BP 124/82   Pulse 80   Wt 182 lb (82.6 kg)   LMP  (LMP Unknown)   Breastfeeding Yes Comment: BOTH  BMI 29.38 kg/m  Physical Exam Exam conducted with a chaperone present.  Constitutional:      General: She is not in acute distress.    Appearance: She is well-developed.  HENT:     Head: Normocephalic and atraumatic.  Eyes:     General: No scleral icterus. Cardiovascular:     Rate and Rhythm: Normal rate.  Pulmonary:     Effort: Pulmonary effort is normal.  Abdominal:     Palpations: Abdomen is soft.  Musculoskeletal:        General: Swelling present.     Cervical back: Neck supple.  Skin:    General: Skin is warm and dry.  Neurological:     Mental Status: She is alert and oriented to person, place, and time.         Assessment & Plan:  Bilateral leg edema - rx for lasix. Leg compression. - Plan: furosemide (LASIX) 20 MG tablet  Other headache syndrome - continue Ibuprofen, should continue to improve.  Return in about 4 weeks (around 11/24/2022) for pp check.  Reva Bores, MD 10/27/2022 4:15 PM

## 2022-10-29 ENCOUNTER — Inpatient Hospital Stay (HOSPITAL_COMMUNITY): Payer: Medicaid Other

## 2022-11-26 ENCOUNTER — Ambulatory Visit: Payer: Medicaid Other | Admitting: *Deleted

## 2022-11-26 VITALS — BP 120/77 | HR 86

## 2022-11-26 DIAGNOSIS — Z3042 Encounter for surveillance of injectable contraceptive: Secondary | ICD-10-CM | POA: Diagnosis not present

## 2022-11-26 MED ORDER — PHEXXI 1.8-1-0.4 % VA GEL: VAGINAL | 2 refills | Status: DC

## 2022-11-26 MED ORDER — MEDROXYPROGESTERONE ACETATE 150 MG/ML IM SUSP: 150.0000 mg | INTRAMUSCULAR | 3 refills | Status: DC

## 2022-11-26 MED ORDER — MEDROXYPROGESTERONE ACETATE 150 MG/ML IM SUSY
150.0000 mg | PREFILLED_SYRINGE | Freq: Once | INTRAMUSCULAR | Status: AC
Start: 2022-11-26 — End: 2022-11-26
  Administered 2022-11-26: 150 mg via INTRAMUSCULAR

## 2022-11-26 NOTE — Progress Notes (Signed)
Pt here to start depo. Pt is 3 weeks postpartum and has not had intercourse.   Date last pap: 10/2020. Last Depo-Provera: NA. Side Effects if any: NA. Serum HCG indicated? NA. Depo-Provera 150 mg IM given by: Scheryl Marten, RN . Next appointment due Nov 8-22nd.   Advised to use back up for the next 2 weeks. Pt would like to refill her phexxi to use as her back up.   Scheryl Marten, RN

## 2022-11-26 NOTE — Progress Notes (Signed)
Attestation of Attending Supervision of clinical support staff: I agree with the care provided to this patient and was available for any consultation.  I have reviewed the RN's note and chart. I was available for consult and to see the patient if needed.   Kimberly Newton MD MPH Attending Physician Faculty Practice- [facility]  

## 2022-11-29 ENCOUNTER — Telehealth (INDEPENDENT_AMBULATORY_CARE_PROVIDER_SITE_OTHER): Payer: Medicaid Other | Admitting: Family Medicine

## 2022-11-29 DIAGNOSIS — Z1332 Encounter for screening for maternal depression: Secondary | ICD-10-CM

## 2022-11-29 DIAGNOSIS — N83201 Unspecified ovarian cyst, right side: Secondary | ICD-10-CM

## 2022-11-29 NOTE — Progress Notes (Signed)
Provider location: Center for [facility]   Patient location: Home  I connected with Altamese Darbydale  on 11/29/22 at  3:10 PM EDT by Mychart Video Encounter and verified that I am speaking with the correct person using two identifiers.       I discussed the limitations, risks, security and privacy concerns of performing an evaluation and management service virtually and the availability of in person appointments. I also discussed with the patient that there may be a patient responsible charge related to this service. The patient expressed understanding and agreed to proceed.  Post Partum Visit Note Subjective:   Marisa Pearson is a 27 y.o. [id number] female who presents for a postpartum visit. She is 6 weeks postpartum following a normal spontaneous vaginal delivery.  I have fully reviewed the prenatal and intrapartum course. The delivery was at [redacted]w[redacted]d gestational weeks.  Anesthesia: epidural. Postpartum course has been complicated by infant's diagnosis. Baby is doing well- was dx with sickle cell disease which has been challenging. Baby is feeding by breast. Bleeding no bleeding. Bowel function is normal. Bladder function is normal. Patient is sexually active. Contraception method is Depo-Provera injections. Postpartum depression screening: positive.   The pregnancy intention screening data noted above was reviewed. Potential methods of contraception were discussed. The patient elected to proceed with No data recorded.   Edinburgh Postnatal Depression Scale - 11/29/22 1522       Edinburgh Postnatal Depression Scale:  In the Past 7 Days   I have been able to laugh and see the funny side of things. 0    I have looked forward with enjoyment to things. 0    I have blamed myself unnecessarily when things went wrong. 3    I have been anxious or worried for no good reason. 0    I have felt scared or panicky for no good reason. 0    Things have been getting  on top of me. 2    I have been so unhappy that I have had difficulty sleeping. 3    I have felt sad or miserable. 0    I have been so unhappy that I have been crying. 2    The thought of harming myself has occurred to me. 0    Edinburgh Postnatal Depression Scale Total 10             The following portions of the patient's history were reviewed and updated as appropriate: allergies, current medications, past family history, past medical history, past social history, past surgical history, and problem list.  Review of Systems Pertinent items are noted in HPI.  Objective:  LMP  (LMP Unknown)     General:  Alert, oriented and cooperative. Patient is in no acute distress.  Respiratory: Normal respiratory effort, no problems with respiration noted  Mental Status: Normal mood and affect. Normal behavior. Normal judgment and thought content.  Rest of physical exam deferred due to type of encounter   Assessment:    Normal postpartum exam.  Plan:  Essential components of care per ACOG recommendations:  1.  Mood and well being: Patient with positive depression screening today. Reviewed local resources for support.  Patient declined referral to [facility]. Encouraged to reach out to support group.  - Patient does not use tobacco.  - hx of drug use? No    2. Infant care and feeding:  -Patient currently breastmilk feeding? Yes  If breastmilk feeding discussed return to  work and pumping. If needed, patient was provided letter for work to allow for every 2-3 hr pumping breaks, and to be granted a private location to express breastmilk and refrigerated area to store breastmilk. Reviewed importance of draining breast regularly to support lactation. -Social determinants of health (SDOH) reviewed in EPIC. No concerns  3. Sexuality, contraception and birth spacing - Patient does not want a pregnancy in the next year.  Desired family size is 3 children.  - Reviewed forms of contraception in tiered  fashion. Patient desired Depo-Provera today.   - Discussed birth spacing of 18 months  4. Sleep and fatigue -Encouraged family/partner/community support of 4 hrs of uninterrupted sleep to help with mood and fatigue  5. Physical Recovery  - Discussed patients delivery and complications - Patient had no laceration, perineal healing reviewed. Patient expressed understanding - Patient has urinary incontinence? No - Patient is safe to resume physical and sexual activity  6.  Health Maintenance - Last pap smear  Up to date  7. Chronic Disease - PCP follow up  I provided 25  minutes of face-to-face time during this encounter.    Return if symptoms worsen or fail to improve.  No future appointments.  Federico Flake, MD Center for [employer], [facility]

## 2022-12-02 DIAGNOSIS — D573 Sickle-cell trait: Secondary | ICD-10-CM | POA: Diagnosis not present

## 2022-12-02 DIAGNOSIS — R053 Chronic cough: Secondary | ICD-10-CM | POA: Diagnosis not present

## 2022-12-02 DIAGNOSIS — R7303 Prediabetes: Secondary | ICD-10-CM | POA: Insufficient documentation

## 2022-12-02 DIAGNOSIS — Z0000 Encounter for general adult medical examination without abnormal findings: Secondary | ICD-10-CM | POA: Diagnosis not present

## 2022-12-03 ENCOUNTER — Ambulatory Visit (HOSPITAL_COMMUNITY)
Admission: RE | Admit: 2022-12-03 | Discharge: 2022-12-03 | Disposition: A | Discharge status: Home / Self Care | Payer: Medicaid Other | Source: Ambulatory Visit | Attending: Family Medicine | Admitting: Family Medicine

## 2022-12-03 DIAGNOSIS — N83209 Unspecified ovarian cyst, unspecified side: Secondary | ICD-10-CM | POA: Diagnosis not present

## 2022-12-03 DIAGNOSIS — N83201 Unspecified ovarian cyst, right side: Secondary | ICD-10-CM | POA: Diagnosis not present

## 2022-12-03 DIAGNOSIS — N83202 Unspecified ovarian cyst, left side: Secondary | ICD-10-CM | POA: Diagnosis not present

## 2023-01-18 DIAGNOSIS — R946 Abnormal results of thyroid function studies: Secondary | ICD-10-CM | POA: Diagnosis not present

## 2023-02-11 ENCOUNTER — Ambulatory Visit: Payer: Medicaid Other | Admitting: *Deleted

## 2023-02-11 VITALS — BP 125/80 | HR 87

## 2023-02-11 DIAGNOSIS — Z3042 Encounter for surveillance of injectable contraceptive: Secondary | ICD-10-CM

## 2023-02-11 MED ORDER — MEDROXYPROGESTERONE ACETATE 150 MG/ML IM SUSY
150.0000 mg | PREFILLED_SYRINGE | Freq: Once | INTRAMUSCULAR | Status: AC
Start: 2023-02-11 — End: 2023-02-11
  Administered 2023-02-11: 150 mg via INTRAMUSCULAR

## 2023-02-11 NOTE — Progress Notes (Signed)
Date last pap: 10/14/2020. Last Depo-Provera: 11/26/2022. Side Effects if any: None. Serum HCG indicated? NA. Depo-Provera 150 mg IM given by: Scheryl Marten, RN . Next appointment due Jan 24-Feb 7th.   Pt plans to come in January and get a ParaGard IUD.   Scheryl Marten, RN

## 2023-05-17 ENCOUNTER — Ambulatory Visit: Payer: Medicaid Other | Admitting: Obstetrics & Gynecology

## 2023-05-17 ENCOUNTER — Encounter: Payer: Self-pay | Admitting: Obstetrics & Gynecology

## 2023-05-17 VITALS — BP 129/86 | HR 76 | Wt 186.0 lb

## 2023-05-17 DIAGNOSIS — Z3009 Encounter for other general counseling and advice on contraception: Secondary | ICD-10-CM

## 2023-05-17 DIAGNOSIS — Z3042 Encounter for surveillance of injectable contraceptive: Secondary | ICD-10-CM

## 2023-05-17 DIAGNOSIS — Z3202 Encounter for pregnancy test, result negative: Secondary | ICD-10-CM

## 2023-05-17 LAB — POCT URINE PREGNANCY: Preg Test, Ur: NEGATIVE

## 2023-05-17 MED ORDER — MEDROXYPROGESTERONE ACETATE 150 MG/ML IM SUSY
150.0000 mg | PREFILLED_SYRINGE | Freq: Once | INTRAMUSCULAR | Status: DC
Start: 2023-05-17 — End: 2023-10-10

## 2023-05-17 MED ORDER — MEDROXYPROGESTERONE ACETATE 150 MG/ML IM SUSP
150.0000 mg | INTRAMUSCULAR | 0 refills | Status: DC
Start: 2023-05-17 — End: 2023-05-17

## 2023-05-17 MED ORDER — MEDROXYPROGESTERONE ACETATE 150 MG/ML IM SUSP
150.0000 mg | INTRAMUSCULAR | 3 refills | Status: DC
Start: 2023-05-17 — End: 2023-10-10

## 2023-05-17 MED ORDER — MEDROXYPROGESTERONE ACETATE 150 MG/ML IM SUSY
150.0000 mg | PREFILLED_SYRINGE | Freq: Once | INTRAMUSCULAR | Status: AC
Start: 2023-05-17 — End: 2023-05-17
  Administered 2023-05-17: 150 mg via INTRAMUSCULAR

## 2023-05-17 NOTE — Patient Instructions (Addendum)
Marisa Pearson

## 2023-05-17 NOTE — Progress Notes (Signed)
GYNECOLOGY OFFICE VISIT NOTE  History:   Marisa Pearson is a 28 y.o. [phone] here today for IUD insertion.  She last had Depo Provera on 02/11/23, considering Paragard now.  She denies any abnormal vaginal discharge, bleeding, pelvic pain or other concerns.    Past Medical History:  Diagnosis Date   Sickle cell trait (HCC) 04/13/2022    Past Surgical History:  Procedure Laterality Date   EXCISION OF BREAST BIOPSY Bilateral 11/28/2018   Procedure: EXCISION OF BILATERAL AXILLARY ACCESSORY BREAST TISSUE;  Surgeon: Manus Rudd, MD;  Location: [facility] OR;  Service: General;  Laterality: Bilateral;    The following portions of the patient's history were reviewed and updated as appropriate: allergies, current medications, past family history, past medical history, past social history, past surgical history and problem list.   Health Maintenance:  Normal pap and negative HRHPV on 10/14/2020.   Review of Systems:  Pertinent items noted in HPI and remainder of comprehensive ROS otherwise negative.  Physical Exam:  BP 129/86   Pulse 76   Wt 186 lb (84.4 kg)   Breastfeeding Yes   BMI 30.02 kg/m  CONSTITUTIONAL: Well-developed, well-nourished female in no acute distress.  HEENT:  Normocephalic, atraumatic. External right and left ear normal. No scleral icterus.  NECK: Normal range of motion, supple, no masses noted on observation SKIN: No rash noted. Not diaphoretic. No erythema. No pallor. MUSCULOSKELETAL: Normal range of motion. No edema noted. NEUROLOGIC: Alert and oriented to person, place, and time. Normal muscle tone coordination. No cranial nerve deficit noted. PSYCHIATRIC: Normal mood and affect. Normal behavior. Normal judgment and thought content. CARDIOVASCULAR: Normal heart rate noted RESPIRATORY: Effort and breath sounds normal, no problems with respiration noted ABDOMEN: No masses noted. No other overt distention noted.   PELVIC: Deferred  Labs and Imaging Results  for orders placed or performed in visit on 05/17/23 (from the past week)  POCT urine pregnancy   Collection Time: 05/17/23 11:19 AM  Result Value Ref Range   Preg Test, Ur Negative Negative   No results found.    Assessment and Plan:     1. Counseling for birth control regarding intrauterine device (IUD) Had a long discussion about IUDs.  Discussed Paragard vs Mirena IUD, discussed differences between both,  Also discussed risks and benefits in detail including but not limited to: irregular bleeding, cramping, infection, malpositioning or misplacement of the IUD outside the uterus which may require further procedure such as laparoscopy. Also discussed >99% contraception efficacy, increased risk of ectopic pregnancy with failure of method.  Apparently, she had Mirena in the past, but expelled this; this was discussed as a possible risk.  All questions answered.  She is unsure right now, will consider this option and let us know if she decides to go with it.   She is leaning more towards Paragard as she is worried about hormonal side effects; had a lot of acne in the past with Nexplanon but has tolerated Depo Provera well.  2. Encounter for Depo-Provera contraception (Primary) Patient was concerns about recent study linking this to brain tumor.  Reassured her that this was a small study done in older women in [place], using Depo Provera as HRT. The overall risk of meningioma in the general population still  remains very low (according to the study, five out of 10,000 women using medroxyprogesterone acetate may possibly develop meningioma compared to one out of 10,000 women not using the medication). She will get a shot of this again  today, and make decision about further injections vs IUD later. - medroxyPROGESTERone Acetate SUSY 150 mg given today - medroxyPROGESTERone (DEPO-PROVERA) 150 MG/ML injection; Inject 1 mL (150 mg total) into the muscle every 3 (three) months.  Dispense: 1 mL; Refill: 3>>  prescribed for subsequent doses.  Routine preventative health maintenance measures emphasized. Please refer to After Visit Summary for other counseling recommendations.   Return in about 3 months (around 08/14/2023) for Depo Provera injection.    I spent 30 minutes dedicated to the care of this patient including pre-visit review of records, face to face time with the patient discussing her conditions and treatments, post visit ordering of medications and appropriate tests or procedures, coordinating care and documenting this visit encounter.    Jaynie Collins, MD, FACOG Obstetrician & Gynecologist, [facility] for [employer], [facility]

## 2023-06-21 IMAGING — US US PELVIS COMPLETE WITH TRANSVAGINAL
1 series · 15 of 25 positions shown · non-contrast
Comparison: None

CLINICAL DATA: Initial evaluation for pelvic pain.



[Series 1: us pelvis complete with transvaginal · 118 acquisitions, 15 frames shown]
[im 1/118]
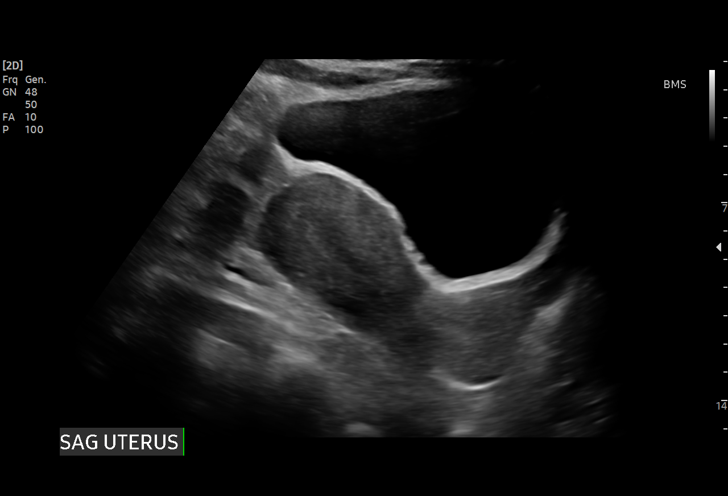
[im 10/118]
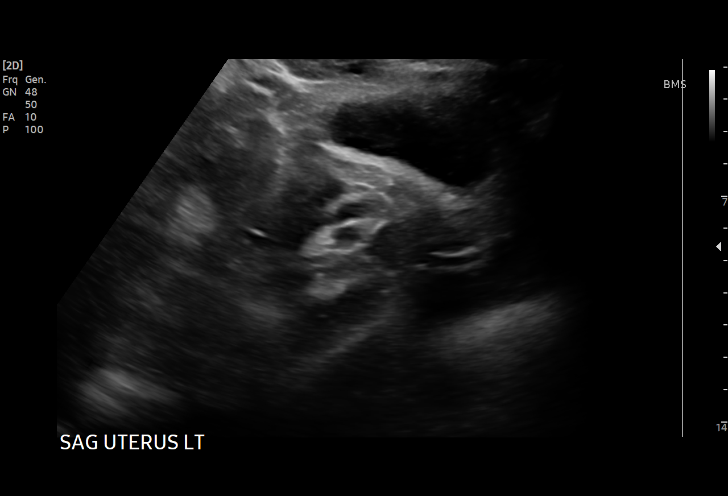
[im 20/118]
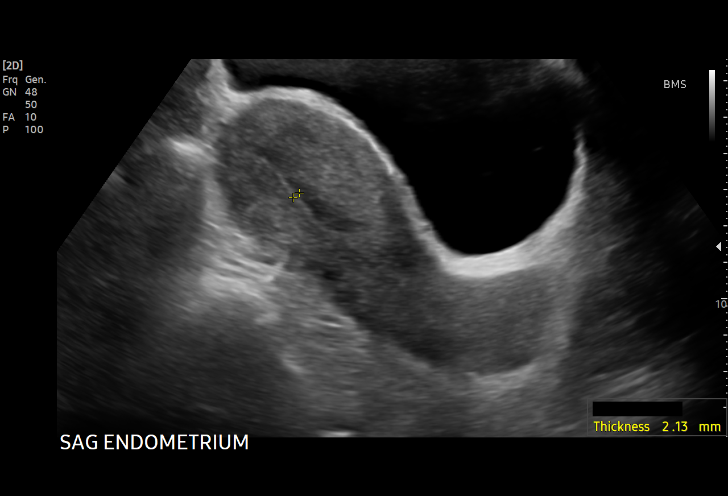
[im 25/118]
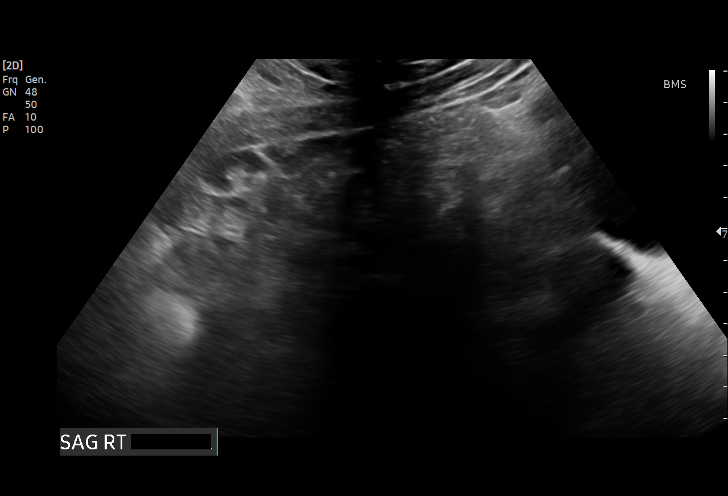
[im 35/118]
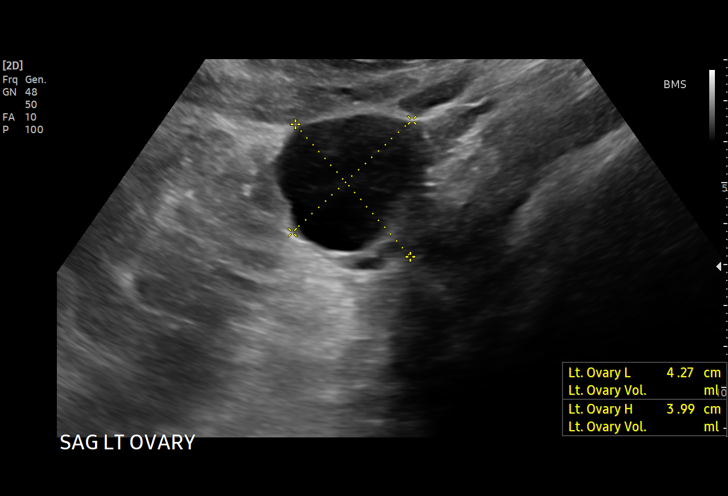
[im 44/118]
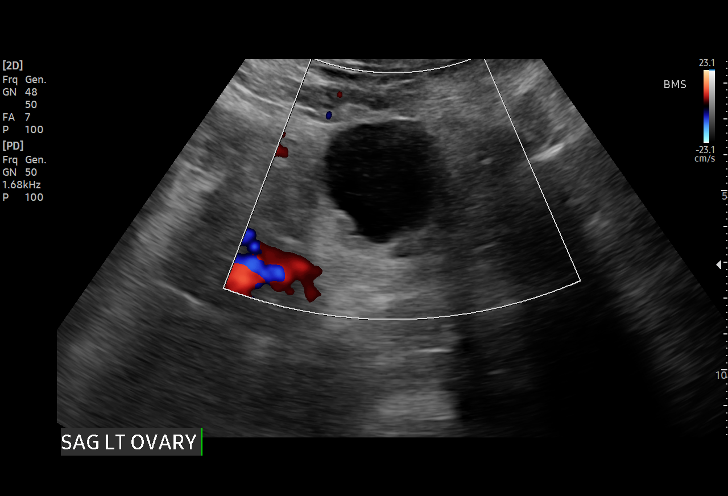
[im 49/118]
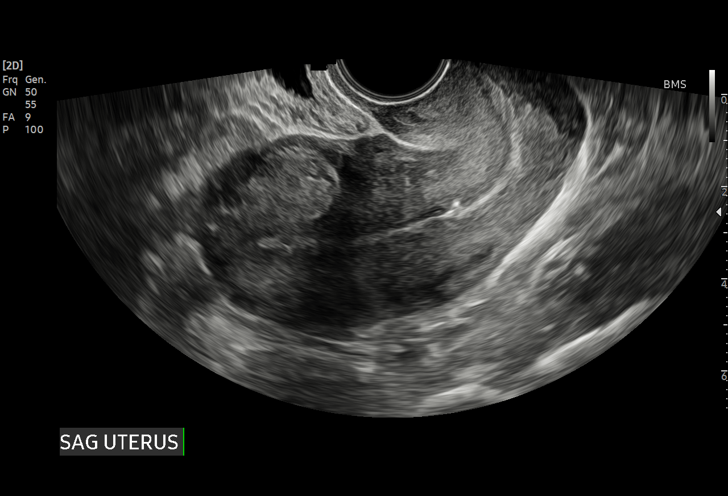
[im 59/118]
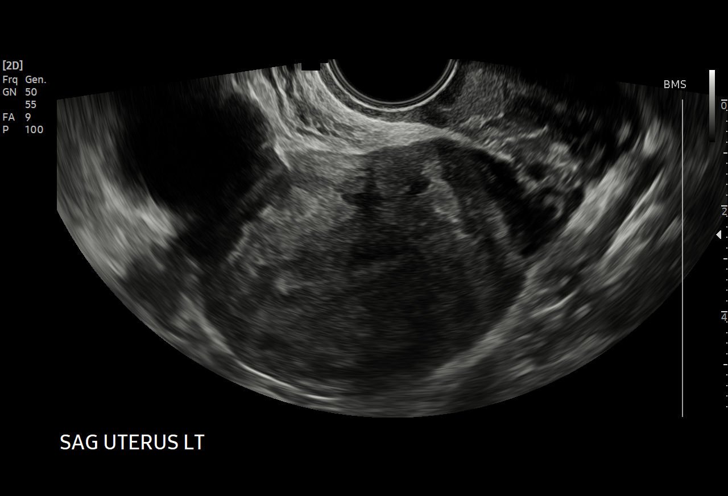
[im 69/118]
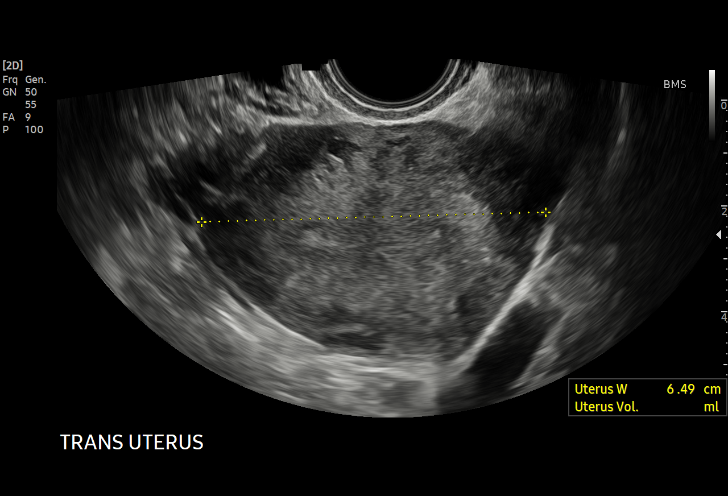
[im 74/118]
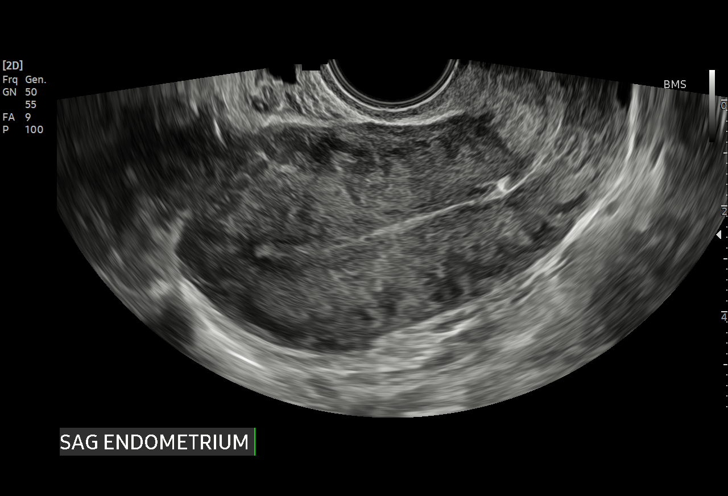
[im 83/118]
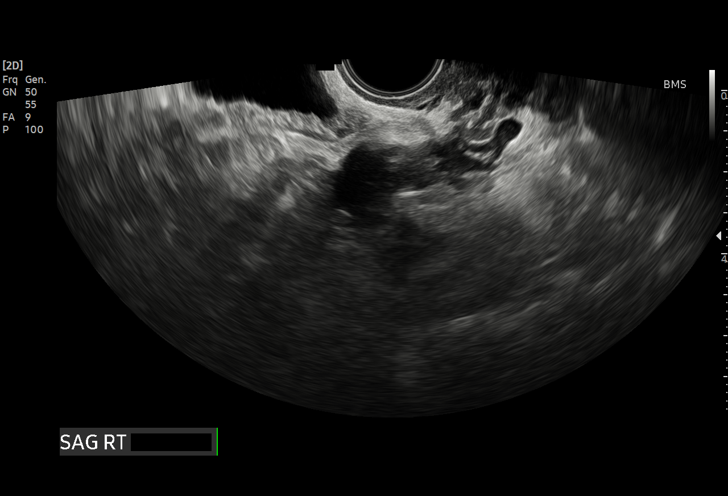
[im 93/118]
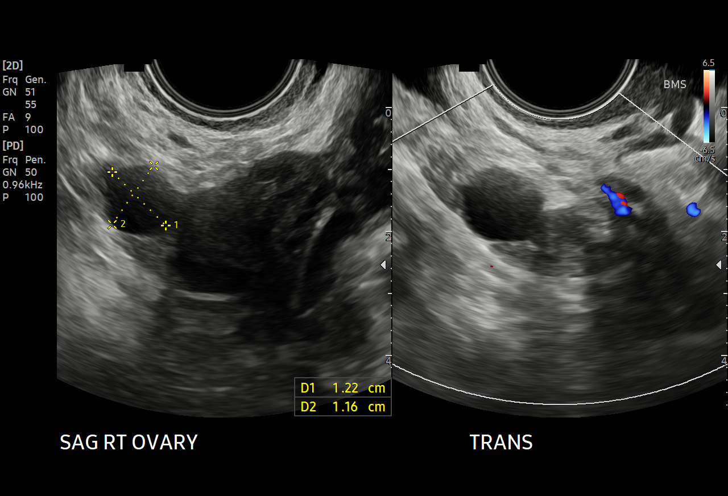
[im 98/118]
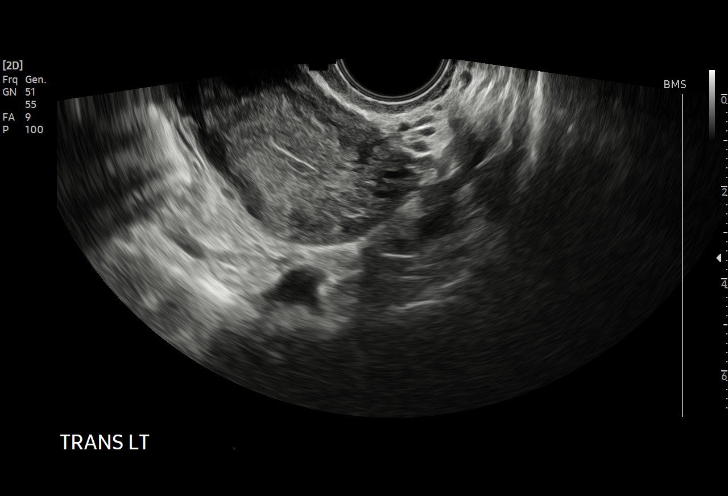
[im 108/118]
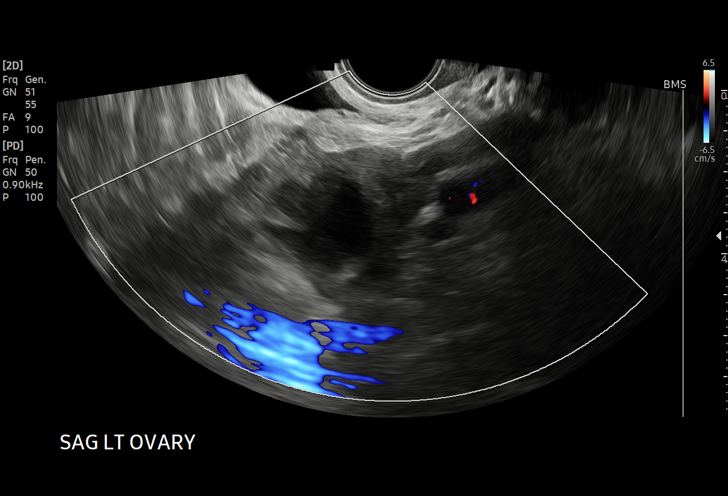
[im 118/118]
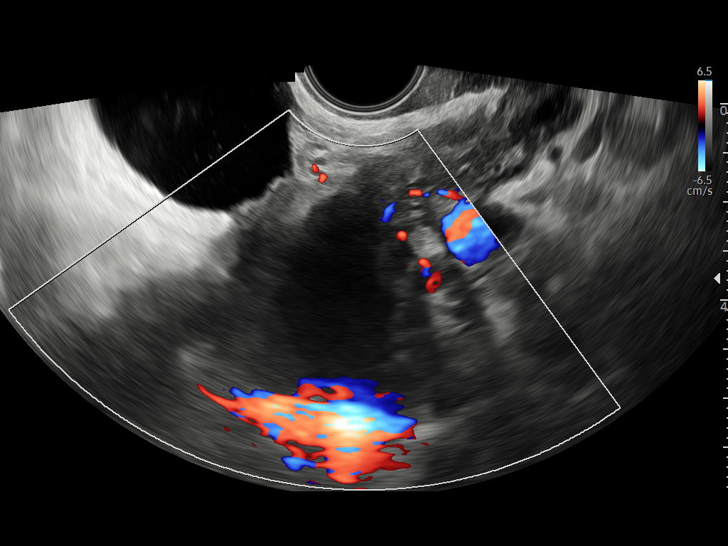

[15 of 25 positions shown; findings below may reference images not displayed]

FINDINGS: Uterus

Measurements: 8.9 x 4.5 x 6.5 cm = volume: 135.2 mL. Uterus is
anteverted. No discrete fibroid or other mass.

Endometrium

Thickness: 3.9 mm.  No focal abnormality visualized.

Right ovary

Measurements: 4.0 x 2.9 x 3.1 cm = volume: 18.5 mL. 1.2 x 1.2 x
cm simple cyst seen extending from the right ovary, which could
reflect an exophytic follicular cyst versus paraovarian cyst. No
significant internal complexity, vascularity, or solid nodularity.

Left ovary

Measurements: 4.2 x 4.3 x 3.6 cm = volume: 34.4 mL. 3.3 x 3.1 x
cm simple cyst. No significant internal complexity, vascularity, or
solid nodularity.

Other findings

No abnormal free fluid.
IMPRESSION: 1. Simple bilateral adnexal cysts measuring up to 1.4 cm on the
right and 3.3 cm on the left. These are almost certainly benign
given size and appearance, with no follow-up imaging recommended.
Note: This recommendation does not apply to premenarchal patients or
to those with increased risk (genetic, family history, elevated
tumor markers or other high-risk factors) of ovarian cancer.
Reference: Radiology [DATE]):[id number].
2. Normal sonographic appearance of the uterus and endometrium.
3. No other acute abnormality within the pelvis.

## 2023-08-02 ENCOUNTER — Ambulatory Visit: Payer: Medicaid Other

## 2023-08-15 ENCOUNTER — Ambulatory Visit: Admitting: Obstetrics and Gynecology

## 2023-08-15 ENCOUNTER — Ambulatory Visit

## 2023-08-15 DIAGNOSIS — Z3042 Encounter for surveillance of injectable contraceptive: Secondary | ICD-10-CM

## 2023-08-15 MED ORDER — MEDROXYPROGESTERONE ACETATE 150 MG/ML IM SUSY
150.0000 mg | PREFILLED_SYRINGE | Freq: Once | INTRAMUSCULAR | Status: AC
Start: 2023-08-15 — End: 2023-08-15
  Administered 2023-08-15: 150 mg via INTRAMUSCULAR

## 2023-08-15 NOTE — Progress Notes (Signed)
 Date last pap: 10/14/20. Last Depo-Provera : 05/17/2023. Side Effects if any: None . Serum HCG indicated? N/A. Depo-Provera  150 mg IM given by:Toya L . Next appointment due 07/28-08/11.

## 2023-08-17 ENCOUNTER — Ambulatory Visit: Admitting: Obstetrics & Gynecology

## 2023-09-06 ENCOUNTER — Encounter: Payer: Self-pay | Admitting: Obstetrics and Gynecology

## 2023-09-06 ENCOUNTER — Ambulatory Visit (INDEPENDENT_AMBULATORY_CARE_PROVIDER_SITE_OTHER): Admitting: Obstetrics and Gynecology

## 2023-09-06 ENCOUNTER — Other Ambulatory Visit (HOSPITAL_COMMUNITY)
Admission: RE | Admit: 2023-09-06 | Discharge: 2023-09-06 | Disposition: A | Discharge status: Home / Self Care | Source: Ambulatory Visit | Attending: Obstetrics and Gynecology | Admitting: Obstetrics and Gynecology

## 2023-09-06 VITALS — BP 126/82 | HR 83 | Wt 203.0 lb

## 2023-09-06 DIAGNOSIS — Z124 Encounter for screening for malignant neoplasm of cervix: Secondary | ICD-10-CM | POA: Insufficient documentation

## 2023-09-06 DIAGNOSIS — Z30430 Encounter for insertion of intrauterine contraceptive device: Secondary | ICD-10-CM

## 2023-09-06 MED ORDER — CLINDAMYCIN PHOS (TWICE-DAILY) 1 % EX GEL: Freq: Two times a day (BID) | CUTANEOUS | 1 refills | Status: DC

## 2023-09-06 MED ORDER — PARAGARD INTRAUTERINE COPPER IU IUD
1.0000 | INTRAUTERINE_SYSTEM | Freq: Once | INTRAUTERINE | Status: AC
Start: 2023-09-06 — End: 2023-09-06
  Administered 2023-09-06: 1 via INTRAUTERINE

## 2023-09-06 NOTE — Progress Notes (Signed)
 28 yo P3 here for IUD insertion. Patient received depo-provera  08/15/23 and opted to start with Copper IUD today, as a non- hormonal option. Patient is without any other complaints  Past Medical History:  Diagnosis Date   Sickle cell trait (HCC) 04/13/2022   Past Surgical History:  Procedure Laterality Date   EXCISION OF BREAST BIOPSY Bilateral 11/28/2018   Procedure: EXCISION OF BILATERAL AXILLARY ACCESSORY BREAST TISSUE;  Surgeon: Dareen Ebbing, MD;  Location: [facility] OR;  Service: General;  Laterality: Bilateral;   Family History  Problem Relation Age of Onset   Hypertension Maternal Grandmother    Hypertension Maternal Grandfather    Social History   Tobacco Use   Smoking status: Never   Smokeless tobacco: Never  Vaping Use   Vaping status: Never Used  Substance Use Topics   Alcohol use: No   Drug use: No   ROS See pertinent in HPI. All other systems reviewed and non contributory Blood pressure 126/82, pulse 83, weight 203 lb (92.1 kg), currently breastfeeding. GENERAL: Well-developed, well-nourished female in no acute distress.  ABDOMEN: Soft, nontender, nondistended. No organomegaly. PELVIC: Normal external female genitalia. Vagina is pink and rugated.  Normal discharge. Normal appearing cervix. Uterus is normal in size. No adnexal mass or tenderness. Chaperone present during the pelvic exam EXTREMITIES: No cyanosis, clubbing, or edema, 2+ distal pulses.  A/P 28 yo here for IUD insertion IUD Procedure Note Patient identified, informed consent performed, signed copy in chart, time out was performed.  Urine pregnancy test negative.  Speculum placed in the vagina.  Cervix visualized.  Cleaned with Betadine x 2.  Grasped anteriorly with a single tooth tenaculum.  Uterus sounded to 8 cm.  Paraguard IUD placed per manufacturer's recommendations.  Strings trimmed to 3 cm. Tenaculum was removed, good hemostasis noted.  Patient tolerated procedure well.   Patient given post procedure  instructions and Paraguard care card with expiration date.  Patient is asked to check IUD strings periodically and follow up in 4-6 weeks for IUD check.

## 2023-09-06 NOTE — Progress Notes (Signed)
 RGYN here for IUD insertion.   LMP: no cycles w/Depo  Currently on Depo.

## 2023-09-12 LAB — CYTOLOGY - PAP: Diagnosis: NEGATIVE

## 2023-09-19 ENCOUNTER — Other Ambulatory Visit: Payer: Self-pay | Admitting: Family Medicine

## 2023-09-19 DIAGNOSIS — J3489 Other specified disorders of nose and nasal sinuses: Secondary | ICD-10-CM | POA: Diagnosis not present

## 2023-09-19 DIAGNOSIS — N6321 Unspecified lump in the left breast, upper outer quadrant: Secondary | ICD-10-CM

## 2023-09-19 DIAGNOSIS — L299 Pruritus, unspecified: Secondary | ICD-10-CM | POA: Diagnosis not present

## 2023-09-19 DIAGNOSIS — R7303 Prediabetes: Secondary | ICD-10-CM | POA: Diagnosis not present

## 2023-09-19 DIAGNOSIS — D573 Sickle-cell trait: Secondary | ICD-10-CM | POA: Diagnosis not present

## 2023-09-19 DIAGNOSIS — Z0000 Encounter for general adult medical examination without abnormal findings: Secondary | ICD-10-CM | POA: Diagnosis not present

## 2023-09-19 DIAGNOSIS — J309 Allergic rhinitis, unspecified: Secondary | ICD-10-CM | POA: Diagnosis not present

## 2023-09-19 DIAGNOSIS — R5383 Other fatigue: Secondary | ICD-10-CM | POA: Diagnosis not present

## 2023-09-27 ENCOUNTER — Ambulatory Visit
Admission: RE | Admit: 2023-09-27 | Discharge: 2023-09-27 | Disposition: A | Discharge status: Home / Self Care | Source: Ambulatory Visit | Attending: Family Medicine | Admitting: Family Medicine

## 2023-09-27 ENCOUNTER — Encounter: Payer: Self-pay | Admitting: Family Medicine

## 2023-09-27 ENCOUNTER — Other Ambulatory Visit: Payer: Self-pay | Admitting: Family Medicine

## 2023-09-27 DIAGNOSIS — N6321 Unspecified lump in the left breast, upper outer quadrant: Secondary | ICD-10-CM

## 2023-09-30 ENCOUNTER — Other Ambulatory Visit: Payer: Self-pay | Admitting: Family Medicine

## 2023-09-30 DIAGNOSIS — R928 Other abnormal and inconclusive findings on diagnostic imaging of breast: Secondary | ICD-10-CM

## 2023-10-03 ENCOUNTER — Ambulatory Visit: Payer: Self-pay | Admitting: Surgery

## 2023-10-03 DIAGNOSIS — N6321 Unspecified lump in the left breast, upper outer quadrant: Secondary | ICD-10-CM | POA: Diagnosis not present

## 2023-10-05 ENCOUNTER — Ambulatory Visit
Admission: RE | Admit: 2023-10-05 | Discharge: 2023-10-05 | Disposition: A | Discharge status: Home / Self Care | Source: Ambulatory Visit | Attending: Family Medicine | Admitting: Family Medicine

## 2023-10-05 DIAGNOSIS — R928 Other abnormal and inconclusive findings on diagnostic imaging of breast: Secondary | ICD-10-CM

## 2023-10-05 DIAGNOSIS — N6321 Unspecified lump in the left breast, upper outer quadrant: Secondary | ICD-10-CM | POA: Diagnosis not present

## 2023-10-05 HISTORY — PX: BREAST BIOPSY: SHX20

## 2023-10-05 MED ORDER — LIDOCAINE-EPINEPHRINE 1 %-1:100000 IJ SOLN
8.0000 mL | Freq: Once | INTRAMUSCULAR | Status: AC
  Administered 2023-10-05: 8 mL
  Filled 2023-10-05: qty 8

## 2023-10-05 MED ORDER — LIDOCAINE 1 % OPTIME INJ - NO CHARGE
2.0000 mL | Freq: Once | INTRAMUSCULAR | Status: AC
  Administered 2023-10-05: 2 mL
  Filled 2023-10-05: qty 2

## 2023-10-06 LAB — SURGICAL PATHOLOGY

## 2023-10-10 ENCOUNTER — Ambulatory Visit: Admitting: Obstetrics & Gynecology

## 2023-10-10 ENCOUNTER — Encounter: Payer: Self-pay | Admitting: Obstetrics & Gynecology

## 2023-10-10 VITALS — BP 121/79 | HR 80 | Wt 195.0 lb

## 2023-10-10 DIAGNOSIS — L700 Acne vulgaris: Secondary | ICD-10-CM

## 2023-10-10 DIAGNOSIS — T8332XA Displacement of intrauterine contraceptive device, initial encounter: Secondary | ICD-10-CM | POA: Diagnosis not present

## 2023-10-10 DIAGNOSIS — Z30431 Encounter for routine checking of intrauterine contraceptive device: Secondary | ICD-10-CM | POA: Diagnosis not present

## 2023-10-10 DIAGNOSIS — Z975 Presence of (intrauterine) contraceptive device: Secondary | ICD-10-CM | POA: Insufficient documentation

## 2023-10-10 MED ORDER — CLINDAMYCIN PHOS (TWICE-DAILY) 1 % EX GEL: Freq: Every day | CUTANEOUS | 5 refills | Status: AC

## 2023-10-10 NOTE — Progress Notes (Signed)
    GYNECOLOGY OFFICE ENCOUNTER NOTE  History:  28 y.o. [id number] here today for today for IUD string check; Paragard   IUD was placed 09/06/2023.  Having expected cramping and irregular bleeding.  No other complaints about the IUD, no other concerning side effects.  The following portions of the patient's history were reviewed and updated as appropriate: allergies, current medications, past family history, past medical history, past social history, past surgical history and problem list. Last pap smear on 09/06/2023 was normal.  Review of Systems:  Pertinent items are noted in HPI.   Objective:  Physical Exam Blood pressure 121/79, pulse 80, weight 195 lb (88.5 kg), currently breastfeeding. CONSTITUTIONAL: Well-developed, well-nourished female in no acute distress.  NEUROLOGIC: Alert and oriented to person, place, and time. Normal reflexes, muscle tone coordination.  PSYCHIATRIC: Normal mood and affect. Normal behavior. Normal judgment and thought content. CARDIOVASCULAR: Normal heart rate noted RESPIRATORY: Effort and breath sounds normal, no problems with respiration noted ABDOMEN: Soft, no distention noted.   PELVIC: Normal appearing external genitalia; normal appearing vaginal mucosa and cervix.  IUD strings not visualized, unable to palpate in cervical canal using endocervical brush. Done in the presence of a chaperone.   Assessment & Plan:  Ultrasound ordered for evaluation of IUD position given that strings are not visualized. If Paragard  in place, patient can keep IUD in place for up ten years; can come in for removal earlier if she desires pregnancy or for any concerning side effects. Patient desired refill of Clindagel for her acne, this was done for her. Routine preventative health maintenance measures emphasized.  I spent 25 minutes dedicated to the care of this patient including pre-visit review of records, face to face time with the patient discussing her conditions and  treatments, post visit ordering of medications and appropriate tests or procedures, coordinating care and documenting this visit encounter.    GLORIS HUGGER, MD, FACOG Obstetrician & Gynecologist, [facility] for [employer], [facility]

## 2023-10-10 NOTE — Progress Notes (Signed)
 CC: IUD Follow up

## 2023-10-11 ENCOUNTER — Ambulatory Visit
Admission: RE | Admit: 2023-10-11 | Discharge: 2023-10-11 | Disposition: A | Discharge status: Home / Self Care | Source: Ambulatory Visit | Attending: Obstetrics & Gynecology | Admitting: Obstetrics & Gynecology

## 2023-10-11 DIAGNOSIS — N854 Malposition of uterus: Secondary | ICD-10-CM | POA: Diagnosis not present

## 2023-10-11 DIAGNOSIS — T8332XA Displacement of intrauterine contraceptive device, initial encounter: Secondary | ICD-10-CM | POA: Insufficient documentation

## 2023-10-11 DIAGNOSIS — Z975 Presence of (intrauterine) contraceptive device: Secondary | ICD-10-CM | POA: Insufficient documentation

## 2023-10-12 ENCOUNTER — Encounter

## 2023-10-12 ENCOUNTER — Other Ambulatory Visit

## 2023-10-13 ENCOUNTER — Ambulatory Visit: Payer: Self-pay | Admitting: Obstetrics & Gynecology

## 2023-10-13 DIAGNOSIS — Z975 Presence of (intrauterine) contraceptive device: Secondary | ICD-10-CM

## 2023-11-09 ENCOUNTER — Other Ambulatory Visit: Payer: Self-pay | Admitting: *Deleted

## 2023-11-09 MED ORDER — NYSTATIN 100000 UNIT/GM EX CREA: 1.0000 | TOPICAL_CREAM | Freq: Two times a day (BID) | CUTANEOUS | 1 refills | Status: DC

## 2023-11-26 ENCOUNTER — Other Ambulatory Visit: Payer: Self-pay | Admitting: Family Medicine

## 2023-11-26 ENCOUNTER — Other Ambulatory Visit: Payer: Self-pay | Admitting: Obstetrics & Gynecology

## 2023-11-26 DIAGNOSIS — B372 Candidiasis of skin and nail: Secondary | ICD-10-CM

## 2023-12-10 ENCOUNTER — Other Ambulatory Visit: Payer: Self-pay | Admitting: Surgery

## 2023-12-10 DIAGNOSIS — N6320 Unspecified lump in the left breast, unspecified quadrant: Secondary | ICD-10-CM

## 2023-12-12 ENCOUNTER — Ambulatory Visit: Payer: Self-pay | Admitting: *Deleted

## 2023-12-12 NOTE — Telephone Encounter (Signed)
 FYI Only or Action Required?: Action required by provider: request for appointment and requesting medication for rash under breasts.  Patient was last seen in primary care on [place] clinic.  Called Nurse Triage reporting Rash.  Symptoms began unknown.  Interventions attempted: Rest, hydration, or home remedies.  Symptoms are: gradually worsening.  Triage Disposition: See PCP When Office is Open (Within 3 Days)  Patient/caregiver understands and will follow disposition?: Yes              Reason for Disposition  Localized rash present > 7 days  Answer Assessment - Initial Assessment Questions Recommended patient call PCP . Patient requesting to be scheduled with new PCP and no appt until 01/20/24. Patient requesting medication powder to be prescribed. Recommended provider will need to see rash to prescribe medication and to call [facility] clinic until after seeing new PCP in October.  Recommended to wash area with mild soap and pat dry allow to air dry as much as possible.    1. APPEARANCE of RASH: What does the rash look like? (e.g., blisters, dry flaky skin, red spots, redness, sores)     Redness  2. LOCATION: Where is the rash located?      Under bilateral breasts  3. NUMBER: How many spots are there?      Na  4. SIZE: How big are the spots? (e.g., inches, cm; or compare to size of pinhead, tip of pen, eraser, pea)      Could not describe 5. ONSET: When did the rash start?      unknown 6. ITCHING: Does the rash itch? If Yes, ask: How bad is the itch?  (Scale 0-10; or none, mild, moderate, severe)     Yes  7. PAIN: Does the rash hurt? If Yes, ask: How bad is the pain?  (Scale 0-10; or none, mild, moderate, severe)     No pain  8. OTHER SYMPTOMS: Do you have any other symptoms? (e.g., fever)     Itching , redness, comes and goes. Requesting powder as prescribed year ago  9. PREGNANCY: Is there any chance you are pregnant? When was your  last menstrual period?     na  Protocols used: Rash or Redness - Localized-A-AH

## 2023-12-22 DIAGNOSIS — N6321 Unspecified lump in the left breast, upper outer quadrant: Secondary | ICD-10-CM | POA: Diagnosis not present

## 2023-12-26 ENCOUNTER — Encounter: Payer: Self-pay | Admitting: Obstetrics and Gynecology

## 2023-12-26 ENCOUNTER — Ambulatory Visit: Admitting: Obstetrics and Gynecology

## 2023-12-26 ENCOUNTER — Other Ambulatory Visit (HOSPITAL_COMMUNITY)
Admission: RE | Admit: 2023-12-26 | Discharge: 2023-12-26 | Disposition: A | Discharge status: Home / Self Care | Source: Ambulatory Visit | Attending: Obstetrics and Gynecology | Admitting: Obstetrics and Gynecology

## 2023-12-26 VITALS — BP 119/78 | HR 80 | Wt 203.0 lb

## 2023-12-26 DIAGNOSIS — Z30431 Encounter for routine checking of intrauterine contraceptive device: Secondary | ICD-10-CM | POA: Diagnosis not present

## 2023-12-26 DIAGNOSIS — N939 Abnormal uterine and vaginal bleeding, unspecified: Secondary | ICD-10-CM

## 2023-12-26 DIAGNOSIS — Z3202 Encounter for pregnancy test, result negative: Secondary | ICD-10-CM

## 2023-12-26 DIAGNOSIS — R21 Rash and other nonspecific skin eruption: Secondary | ICD-10-CM | POA: Diagnosis not present

## 2023-12-26 DIAGNOSIS — T8332XD Displacement of intrauterine contraceptive device, subsequent encounter: Secondary | ICD-10-CM | POA: Diagnosis not present

## 2023-12-26 LAB — POCT URINALYSIS DIPSTICK
Blood, UA: NEGATIVE
Leukocytes, UA: NEGATIVE

## 2023-12-26 LAB — POCT URINE PREGNANCY: Preg Test, Ur: NEGATIVE

## 2023-12-26 NOTE — Progress Notes (Unsigned)
 RGYN here for IUD check had paragard  Inserted 09/06/23  LMP: irregular at this time.  CC: Discomfort w/ IUD pt feels as if something is poking sometimes. Also notes thrush under breast notes itching and smell.  *Pt considering taking IUD in 1-3 yrs does desire a 4th child.

## 2023-12-27 LAB — CERVICOVAGINAL ANCILLARY ONLY
Bacterial Vaginitis (gardnerella): NEGATIVE
Candida Glabrata: NEGATIVE
Candida Vaginitis: NEGATIVE
Chlamydia: NEGATIVE
Comment: NEGATIVE
Comment: NEGATIVE
Comment: NEGATIVE
Comment: NEGATIVE
Comment: NEGATIVE
Comment: NORMAL
Neisseria Gonorrhea: NEGATIVE
Trichomonas: NEGATIVE

## 2023-12-28 NOTE — Progress Notes (Signed)
 Obstetrics and Gynecology Visit Return Patient Evaluation  Appointment Date: 12/26/2023  Primary Care Provider: Harvey Gaetana CROME  OBGYN Clinic: Center for [facility]  Chief Complaint: spotting with paragard   History of Present Illness:  Marisa Pearson is a 28 y.o. s/p 6/3 paragard  insertion with Dr. Alger. Seen by Dr. Herchel on 7/7 for string check and strings not seen; 7/8 u/s showed IUD in proper location and uterus and ovaries wnl.     Interval History: Since that time, she states that irregular spotting and cramping. She states she had cramping with a period about 1-2wks ago  Review of Systems:  as noted in the History of Present Illness.  Patient Active Problem List   Diagnosis Date Noted   IUD strings lost 10/10/2023   Medications:  Jewelz Abdoul Gracia Pearson had no medications administered during this visit. Current Outpatient Medications  Medication Sig Dispense Refill   PARAGARD  INTRAUTERINE COPPER  IU by Intrauterine route.     clindamycin  (CLINDAGEL) 1 % gel Apply topically at bedtime. 60 g 5   ibuprofen  (ADVIL ) 600 MG tablet TAKE 1 TABLET BY MOUTH EVERY 6 (SIX) HOURS AS NEEDED FOR HEADACHE, MILD PAIN OR MODERATE PAIN. 60 tablet 2   nystatin  cream (MYCOSTATIN ) Apply 1 Application topically 2 (two) times daily. 30 g 1   No current facility-administered medications for this visit.    Allergies: is allergic to peanut-containing drug products and pork-derived products.  Physical Exam:  BP 119/78   Pulse 80   Wt 203 lb (92.1 kg)   BMI 32.77 kg/m  Body mass index is 32.77 kg/m. General appearance: Well nourished, well developed female in no acute distress.  Abdomen: diffusely non tender to palpation, non distended, and no masses, hernias Neuro/Psych:  Normal mood and affect.    Pelvic exam:  Cervical exam performed in the presence of a chaperone EGBUS: wnl Vaginal vault: wnl Cervix:  IUD strings not seen. Nttp, normal  appearing Bimanual: negative  Labs: UPT and U/A neg  Assessment: patient stable  Plan:  1. Vaginal spotting (Primary) D/w her that can be normal with IUD and recommend expectant management. She does have periods - Cervicovaginal ancillary only( [place]) - POCT Urinalysis Dipstick - POCT urine pregnancy  2. Intrauterine contraceptive device threads lost, subsequent encounter Reviewed u/s results with her  3. Skin rash Patient states she's seeing derm and her pcp for a rash and is wondering if their diagnosis is correct. I told her I would defer to them.   Return if symptoms worsen or fail to improve.  Future Appointments  Date Time Provider Department Center  01/30/2024 10:00 AM Vincente Shivers, NP [facility] [address]  04/13/2024 10:40 AM [facility] US  2 [facility]    Bebe Izell Raddle MD Attending Center for [facility])

## 2024-01-06 ENCOUNTER — Ambulatory Visit
Admission: RE | Admit: 2024-01-06 | Discharge: 2024-01-06 | Disposition: A | Discharge status: Home / Self Care | Source: Ambulatory Visit | Attending: Surgery | Admitting: Surgery

## 2024-01-06 DIAGNOSIS — N6489 Other specified disorders of breast: Secondary | ICD-10-CM | POA: Diagnosis not present

## 2024-01-06 DIAGNOSIS — N6320 Unspecified lump in the left breast, unspecified quadrant: Secondary | ICD-10-CM

## 2024-01-09 ENCOUNTER — Ambulatory Visit: Payer: Self-pay | Admitting: Surgery

## 2024-01-09 DIAGNOSIS — D4862 Neoplasm of uncertain behavior of left breast: Secondary | ICD-10-CM | POA: Diagnosis not present

## 2024-01-09 DIAGNOSIS — N6320 Unspecified lump in the left breast, unspecified quadrant: Secondary | ICD-10-CM

## 2024-01-09 NOTE — H&P (Signed)
 SABRA

## 2024-01-09 NOTE — H&P (View-Only) (Signed)
 SABRA

## 2024-01-10 ENCOUNTER — Other Ambulatory Visit: Payer: Self-pay | Admitting: Surgery

## 2024-01-10 DIAGNOSIS — N6320 Unspecified lump in the left breast, unspecified quadrant: Secondary | ICD-10-CM

## 2024-01-11 ENCOUNTER — Encounter (HOSPITAL_BASED_OUTPATIENT_CLINIC_OR_DEPARTMENT_OTHER): Payer: Self-pay | Admitting: Surgery

## 2024-01-13 ENCOUNTER — Ambulatory Visit
Admission: RE | Admit: 2024-01-13 | Discharge: 2024-01-13 | Disposition: A | Discharge status: Home / Self Care | Source: Ambulatory Visit | Attending: Surgery | Admitting: Surgery

## 2024-01-13 ENCOUNTER — Other Ambulatory Visit: Payer: Self-pay | Admitting: Surgery

## 2024-01-13 DIAGNOSIS — N6320 Unspecified lump in the left breast, unspecified quadrant: Secondary | ICD-10-CM

## 2024-01-13 HISTORY — PX: BREAST BIOPSY: SHX20

## 2024-01-13 MED ORDER — CHLORHEXIDINE GLUCONATE CLOTH 2 % EX PADS: 6.0000 | MEDICATED_PAD | Freq: Once | CUTANEOUS | Status: DC

## 2024-01-13 NOTE — Progress Notes (Signed)

## 2024-01-16 NOTE — Anesthesia Preprocedure Evaluation (Signed)
 Anesthesia Evaluation  Patient identified by MRN, date of birth, ID band Patient awake    Reviewed: Allergy & Precautions, NPO status , Patient's Chart, lab work & pertinent test results  History of Anesthesia Complications Negative for: history of anesthetic complications  Airway Mallampati: II  TM Distance: >3 FB Neck ROM: Full    Dental no notable dental hx. (+) Teeth Intact, Dental Advisory Given   Pulmonary neg pulmonary ROS   Pulmonary exam normal breath sounds clear to auscultation       Cardiovascular negative cardio ROS Normal cardiovascular exam Rhythm:Regular Rate:Normal     Neuro/Psych negative neurological ROS  negative psych ROS   GI/Hepatic negative GI ROS, Neg liver ROS,,,  Endo/Other  negative endocrine ROS    Renal/GU negative Renal ROS     Musculoskeletal   Abdominal   Peds  Hematology   Anesthesia Other Findings All: pork, peanut  Reproductive/Obstetrics                              Anesthesia Physical Anesthesia Plan  ASA: 1  Anesthesia Plan: General   Post-op Pain Management: Precedex and Ofirmev  IV (intra-op)*   Induction: Intravenous  PONV Risk Score and Plan: 4 or greater and Propofol  infusion, TIVA, Treatment may vary due to age or medical condition, Ondansetron , Midazolam and Dexamethasone   Airway Management Planned: LMA  Additional Equipment: None  Intra-op Plan:   Post-operative Plan: Extubation in OR  Informed Consent: I have reviewed the patients History and Physical, chart, labs and discussed the procedure including the risks, benefits and alternatives for the proposed anesthesia with the patient or authorized representative who has indicated his/her understanding and acceptance.     Dental advisory given  Plan Discussed with: CRNA and Surgeon  Anesthesia Plan Comments:          Anesthesia Quick Evaluation

## 2024-01-17 ENCOUNTER — Ambulatory Visit (HOSPITAL_BASED_OUTPATIENT_CLINIC_OR_DEPARTMENT_OTHER)
Admission: RE | Admit: 2024-01-17 | Discharge: 2024-01-17 | Disposition: A | Discharge status: Home / Self Care | Attending: Surgery | Admitting: Surgery

## 2024-01-17 ENCOUNTER — Encounter (HOSPITAL_BASED_OUTPATIENT_CLINIC_OR_DEPARTMENT_OTHER): Payer: Self-pay | Admitting: Surgery

## 2024-01-17 ENCOUNTER — Other Ambulatory Visit: Payer: Self-pay

## 2024-01-17 ENCOUNTER — Ambulatory Visit (HOSPITAL_BASED_OUTPATIENT_CLINIC_OR_DEPARTMENT_OTHER): Payer: Self-pay | Admitting: Anesthesiology

## 2024-01-17 ENCOUNTER — Encounter (HOSPITAL_BASED_OUTPATIENT_CLINIC_OR_DEPARTMENT_OTHER)
Admission: RE | Disposition: A | Discharge status: Home / Self Care | Payer: Self-pay | Source: Home / Self Care | Attending: Surgery

## 2024-01-17 ENCOUNTER — Ambulatory Visit
Admission: RE | Admit: 2024-01-17 | Discharge: 2024-01-17 | Disposition: A | Discharge status: Home / Self Care | Source: Ambulatory Visit | Attending: Surgery | Admitting: Surgery

## 2024-01-17 DIAGNOSIS — N6321 Unspecified lump in the left breast, upper outer quadrant: Secondary | ICD-10-CM | POA: Insufficient documentation

## 2024-01-17 DIAGNOSIS — Z01818 Encounter for other preprocedural examination: Secondary | ICD-10-CM

## 2024-01-17 DIAGNOSIS — N6082 Other benign mammary dysplasias of left breast: Secondary | ICD-10-CM | POA: Diagnosis not present

## 2024-01-17 DIAGNOSIS — N6320 Unspecified lump in the left breast, unspecified quadrant: Secondary | ICD-10-CM

## 2024-01-17 DIAGNOSIS — N6012 Diffuse cystic mastopathy of left breast: Secondary | ICD-10-CM | POA: Diagnosis not present

## 2024-01-17 DIAGNOSIS — N641 Fat necrosis of breast: Secondary | ICD-10-CM | POA: Diagnosis not present

## 2024-01-17 HISTORY — PX: BREAST LUMPECTOMY WITH RADIOACTIVE SEED LOCALIZATION: SHX6424

## 2024-01-17 LAB — POCT PREGNANCY, URINE: Preg Test, Ur: NEGATIVE

## 2024-01-17 SURGERY — BREAST LUMPECTOMY WITH RADIOACTIVE SEED LOCALIZATION
Anesthesia: General | Laterality: Left

## 2024-01-17 MED ORDER — DEXAMETHASONE SODIUM PHOSPHATE 4 MG/ML IJ SOLN
INTRAMUSCULAR | Status: DC | PRN
  Administered 2024-01-17: 10 mg via INTRAVENOUS

## 2024-01-17 MED ORDER — OXYCODONE HCL 5 MG/5ML PO SOLN: 5.0000 mg | Freq: Once | ORAL | Status: DC | PRN

## 2024-01-17 MED ORDER — CEFAZOLIN SODIUM-DEXTROSE 2-4 GM/100ML-% IV SOLN
2.0000 g | INTRAVENOUS | Status: AC
  Administered 2024-01-17: 2 g via INTRAVENOUS

## 2024-01-17 MED ORDER — MIDAZOLAM HCL 2 MG/2ML IJ SOLN
INTRAMUSCULAR | Status: AC
  Filled 2024-01-17: qty 2

## 2024-01-17 MED ORDER — ONDANSETRON HCL 4 MG/2ML IJ SOLN
4.0000 mg | Freq: Once | INTRAMUSCULAR | Status: DC | PRN
Start: 2024-01-17 — End: 2024-01-17

## 2024-01-17 MED ORDER — PROPOFOL 500 MG/50ML IV EMUL
INTRAVENOUS | Status: DC | PRN
  Administered 2024-01-17: 225 ug/kg/min via INTRAVENOUS

## 2024-01-17 MED ORDER — ONDANSETRON HCL 4 MG/2ML IJ SOLN
INTRAMUSCULAR | Status: AC
  Filled 2024-01-17: qty 2

## 2024-01-17 MED ORDER — EPHEDRINE SULFATE (PRESSORS) 50 MG/ML IJ SOLN
INTRAMUSCULAR | Status: DC | PRN
  Administered 2024-01-17: 5 mg via INTRAVENOUS

## 2024-01-17 MED ORDER — BUPIVACAINE-EPINEPHRINE 0.25% -1:200000 IJ SOLN
INTRAMUSCULAR | Status: DC | PRN
  Administered 2024-01-17: 10 mL

## 2024-01-17 MED ORDER — KETOROLAC TROMETHAMINE 30 MG/ML IJ SOLN
INTRAMUSCULAR | Status: DC | PRN
  Administered 2024-01-17: 30 mg via INTRAVENOUS

## 2024-01-17 MED ORDER — KETOROLAC TROMETHAMINE 30 MG/ML IJ SOLN
INTRAMUSCULAR | Status: AC
  Filled 2024-01-17: qty 1

## 2024-01-17 MED ORDER — LIDOCAINE 2% (20 MG/ML) 5 ML SYRINGE
INTRAMUSCULAR | Status: AC
  Filled 2024-01-17: qty 5

## 2024-01-17 MED ORDER — KETOROLAC TROMETHAMINE 30 MG/ML IJ SOLN: 30.0000 mg | Freq: Once | INTRAMUSCULAR | Status: DC | PRN

## 2024-01-17 MED ORDER — MIDAZOLAM HCL 5 MG/5ML IJ SOLN
INTRAMUSCULAR | Status: DC | PRN
  Administered 2024-01-17: 2 mg via INTRAVENOUS

## 2024-01-17 MED ORDER — DEXMEDETOMIDINE HCL IN NACL 80 MCG/20ML IV SOLN
INTRAVENOUS | Status: DC | PRN
  Administered 2024-01-17: 8 ug via INTRAVENOUS

## 2024-01-17 MED ORDER — FENTANYL CITRATE (PF) 100 MCG/2ML IJ SOLN
INTRAMUSCULAR | Status: DC | PRN
  Administered 2024-01-17: 100 ug via INTRAVENOUS

## 2024-01-17 MED ORDER — 0.9 % SODIUM CHLORIDE (POUR BTL) OPTIME
TOPICAL | Status: DC | PRN
  Administered 2024-01-17: 1000 mL

## 2024-01-17 MED ORDER — LIDOCAINE 2% (20 MG/ML) 5 ML SYRINGE
INTRAMUSCULAR | Status: DC | PRN
  Administered 2024-01-17: 100 mg via INTRAVENOUS

## 2024-01-17 MED ORDER — CEFAZOLIN SODIUM-DEXTROSE 2-4 GM/100ML-% IV SOLN
INTRAVENOUS | Status: AC
Start: 2024-01-17 — End: 2024-01-17
  Filled 2024-01-17: qty 100

## 2024-01-17 MED ORDER — FENTANYL CITRATE (PF) 100 MCG/2ML IJ SOLN
INTRAMUSCULAR | Status: AC
  Filled 2024-01-17: qty 2

## 2024-01-17 MED ORDER — EPHEDRINE 5 MG/ML INJ
INTRAVENOUS | Status: AC
  Filled 2024-01-17: qty 10

## 2024-01-17 MED ORDER — ACETAMINOPHEN 500 MG PO TABS
1000.0000 mg | ORAL_TABLET | ORAL | Status: AC
  Administered 2024-01-17: 1000 mg via ORAL

## 2024-01-17 MED ORDER — LACTATED RINGERS IV SOLN: INTRAVENOUS | Status: DC

## 2024-01-17 MED ORDER — HYDROMORPHONE HCL 1 MG/ML IJ SOLN: 0.2500 mg | INTRAMUSCULAR | Status: DC | PRN

## 2024-01-17 MED ORDER — ACETAMINOPHEN 500 MG PO TABS
ORAL_TABLET | ORAL | Status: AC
  Filled 2024-01-17: qty 2

## 2024-01-17 MED ORDER — OXYCODONE HCL 5 MG PO TABS: 5.0000 mg | ORAL_TABLET | Freq: Once | ORAL | Status: DC | PRN

## 2024-01-17 MED ORDER — PROPOFOL 10 MG/ML IV BOLUS
INTRAVENOUS | Status: DC | PRN
  Administered 2024-01-17: 170 mg via INTRAVENOUS

## 2024-01-17 MED ORDER — ONDANSETRON HCL 4 MG/2ML IJ SOLN
INTRAMUSCULAR | Status: DC | PRN
  Administered 2024-01-17: 4 mg via INTRAVENOUS

## 2024-01-17 SURGICAL SUPPLY — 38 items
BENZOIN TINCTURE PRP APPL 2/3 (GAUZE/BANDAGES/DRESSINGS) ×1 IMPLANT
BLADE HEX COATED 2.75 (ELECTRODE) ×1 IMPLANT
BLADE SURG 15 STRL LF DISP TIS (BLADE) ×1 IMPLANT
CANISTER SUC SOCK COL 7IN (MISCELLANEOUS) IMPLANT
CANISTER SUCT 1200ML W/VALVE (MISCELLANEOUS) IMPLANT
CHLORAPREP W/TINT 26 (MISCELLANEOUS) ×1 IMPLANT
CLIP APPLIE 9.375 MED OPEN (MISCELLANEOUS) ×1 IMPLANT
COVER BACK TABLE 60X90IN (DRAPES) ×1 IMPLANT
COVER MAYO STAND STRL (DRAPES) ×1 IMPLANT
COVER PROBE CYLINDRICAL 5X96 (MISCELLANEOUS) ×1 IMPLANT
DRAPE LAPAROTOMY 100X72 PEDS (DRAPES) ×1 IMPLANT
DRAPE UTILITY XL STRL (DRAPES) ×1 IMPLANT
DRSG TEGADERM 4X4.75 (GAUZE/BANDAGES/DRESSINGS) ×1 IMPLANT
ELECTRODE REM PT RTRN 9FT ADLT (ELECTROSURGICAL) ×1 IMPLANT
GAUZE SPONGE 2X2 STRL 8-PLY (GAUZE/BANDAGES/DRESSINGS) IMPLANT
GAUZE SPONGE 4X4 12PLY STRL LF (GAUZE/BANDAGES/DRESSINGS) IMPLANT
GLOVE BIO SURGEON STRL SZ7 (GLOVE) ×1 IMPLANT
GLOVE BIOGEL PI IND STRL 7.5 (GLOVE) ×1 IMPLANT
GOWN STRL REUS W/ TWL LRG LVL3 (GOWN DISPOSABLE) ×2 IMPLANT
KIT MARKER MARGIN INK (KITS) ×1 IMPLANT
NDL HYPO 25X1 1.5 SAFETY (NEEDLE) ×1 IMPLANT
NEEDLE HYPO 25X1 1.5 SAFETY (NEEDLE) ×1 IMPLANT
NS IRRIG 1000ML POUR BTL (IV SOLUTION) ×1 IMPLANT
PACK BASIN DAY SURGERY FS (CUSTOM PROCEDURE TRAY) ×1 IMPLANT
PENCIL SMOKE EVACUATOR (MISCELLANEOUS) ×1 IMPLANT
SLEEVE SCD COMPRESS KNEE MED (STOCKING) ×1 IMPLANT
SPIKE FLUID TRANSFER (MISCELLANEOUS) IMPLANT
SPONGE T-LAP 4X18 ~~LOC~~+RFID (SPONGE) ×1 IMPLANT
STRIP CLOSURE SKIN 1/2X4 (GAUZE/BANDAGES/DRESSINGS) ×1 IMPLANT
SUT MON AB 4-0 PC3 18 (SUTURE) ×1 IMPLANT
SUT SILK 2 0 SH (SUTURE) IMPLANT
SUT VIC AB 3-0 SH 27X BRD (SUTURE) ×1 IMPLANT
SYR BULB EAR ULCER 3OZ GRN STR (SYRINGE) IMPLANT
SYR CONTROL 10ML LL (SYRINGE) ×1 IMPLANT
TOWEL GREEN STERILE FF (TOWEL DISPOSABLE) ×1 IMPLANT
TRAY FAXITRON CT DISP (TRAY / TRAY PROCEDURE) ×1 IMPLANT
TUBE CONNECTING 20X1/4 (TUBING) IMPLANT
YANKAUER SUCT BULB TIP NO VENT (SUCTIONS) IMPLANT

## 2024-01-17 NOTE — Transfer of Care (Signed)
 Immediate Anesthesia Transfer of Care Note  Patient: Marisa Pearson  Procedure(s) Performed: BREAST LUMPECTOMY WITH RADIOACTIVE SEED LOCALIZATION (Left)  Patient Location: PACU  Anesthesia Type:General  Level of Consciousness: drowsy  Airway & Oxygen Therapy: Patient Spontanous Breathing and Patient connected to face mask oxygen  Post-op Assessment: Report given to RN and Post -op Vital signs reviewed and stable  Post vital signs: Reviewed and stable  Last Vitals:  Vitals Value Taken Time  BP 91/52 01/17/24 11:56  Temp    Pulse 76 01/17/24 11:58  Resp 13 01/17/24 11:58  SpO2 99 % 01/17/24 11:58  Vitals shown include unfiled device data.  Last Pain:  Vitals:   01/17/24 1031  TempSrc: Temporal  PainSc: 0-No pain         Complications: No notable events documented.

## 2024-01-17 NOTE — Interval H&P Note (Signed)
 History and Physical Interval Note:  01/17/2024 10:48 AM  Marisa Pearson  has presented today for surgery, with the diagnosis of LEFT BREAST MASS.  The various methods of treatment have been discussed with the patient and family. After consideration of risks, benefits and other options for treatment, the patient has consented to  Procedure(s) with comments: BREAST LUMPECTOMY WITH RADIOACTIVE SEED LOCALIZATION (Left) - LEFT BREAST RADIOACTIVE SEED LOCALIZED LUMPECTOMY as a surgical intervention.  The patient's history has been reviewed, patient examined, no change in status, stable for surgery.  I have reviewed the patient's chart and labs.  Questions were answered to the patient's satisfaction.     [name]

## 2024-01-17 NOTE — Discharge Instructions (Addendum)
 [facility] Office Phone Number [phone]  BREAST BIOPSY/ PARTIAL MASTECTOMY: POST OP INSTRUCTIONS  Always review your discharge instruction sheet given to you by the facility where your surgery was performed.  IF YOU HAVE DISABILITY OR FAMILY LEAVE FORMS, YOU MUST BRING THEM TO THE OFFICE FOR PROCESSING.  DO NOT GIVE THEM TO YOUR DOCTOR.  A prescription for pain medication may be given to you upon discharge.  Take your pain medication as prescribed, if needed.  If narcotic pain medicine is not needed, then you may take acetaminophen  (Tylenol ) or ibuprofen  (Advil ) as needed. Take your usually prescribed medications unless otherwise directed If you need a refill on your pain medication, please contact your pharmacy.  They will contact our office to request authorization.  Prescriptions will not be filled after 5pm or on week-ends. You should eat very light the first 24 hours after surgery, such as soup, crackers, pudding, etc.  Resume your normal diet the day after surgery. Most patients will experience some swelling and bruising in the breast.  Ice packs and a good support bra will help.  Swelling and bruising can take several days to resolve.  It is common to experience some constipation if taking pain medication after surgery.  Increasing fluid intake and taking a stool softener will usually help or prevent this problem from occurring.  A mild laxative (Milk of Magnesia or Miralax) should be taken according to package directions if there are no bowel movements after 48 hours. Unless discharge instructions indicate otherwise, you may remove your bandages 24-48 hours after surgery, and you may shower at that time.  You may have steri-strips (small skin tapes) in place directly over the incision.  These strips should be left on the skin for 7-10 days.  If your surgeon used skin glue on the incision, you may shower in 24 hours.  The glue will flake off over the next 2-3 weeks.  Any  sutures or staples will be removed at the office during your follow-up visit. ACTIVITIES:  You may resume regular daily activities (gradually increasing) beginning the next day.  Wearing a good support bra or sports bra minimizes pain and swelling.  You may have sexual intercourse when it is comfortable. You may drive when you no longer are taking prescription pain medication, you can comfortably wear a seatbelt, and you can safely maneuver your car and apply brakes. RETURN TO WORK:  ______________________________________________________________________________________ Marisa Pearson should see your doctor in the office for a follow-up appointment approximately two weeks after your surgery.  Your doctor's nurse will typically make your follow-up appointment when she calls you with your pathology report.  Expect your pathology report 2-3 business days after your surgery.  You may call to check if you do not hear from us  after three days. OTHER INSTRUCTIONS: _______________________________________________________________________________________________ _____________________________________________________________________________________________________________________________________ _____________________________________________________________________________________________________________________________________ _____________________________________________________________________________________________________________________________________  WHEN TO CALL YOUR DOCTOR: Fever over 101.0 Nausea and/or vomiting. Extreme swelling or bruising. Continued bleeding from incision. Increased pain, redness, or drainage from the incision.  The clinic staff is available to answer your questions during regular business hours.  Please don't hesitate to call and ask to speak to one of the nurses for clinical concerns.  If you have a medical emergency, go to the nearest emergency room or call 911.  A surgeon from [facility] is always on call at the hospital.  For further questions, please visit [email or web address]     NO Tylenol  until 10:33 pm  No Ibuprofen  until 7:00 pm

## 2024-01-17 NOTE — Anesthesia Postprocedure Evaluation (Signed)
 Anesthesia Post Note  Patient: Marisa Pearson  Procedure(s) Performed: BREAST LUMPECTOMY WITH RADIOACTIVE SEED LOCALIZATION (Left)     Patient location during evaluation: PACU Anesthesia Type: General Level of consciousness: awake and alert Pain management: pain level controlled Vital Signs Assessment: post-procedure vital signs reviewed and stable Respiratory status: spontaneous breathing, nonlabored ventilation, respiratory function stable and patient connected to nasal cannula oxygen Cardiovascular status: blood pressure returned to baseline and stable Postop Assessment: no apparent nausea or vomiting Anesthetic complications: no   No notable events documented.  Last Vitals:  Vitals:   01/17/24 1315 01/17/24 1350  BP:  129/87  Pulse: 64 85  Resp: (!) 27 18  Temp:  (!) 36.2 C  SpO2: 96% 95%    Last Pain:  Vitals:   01/17/24 1350  TempSrc:   PainSc: 0-No pain                 Garnette DELENA Gab

## 2024-01-17 NOTE — Op Note (Signed)
 Pre-op diagnosis: Left upper outer quadrant breast mass Postop diagnosis: Same Procedure performed: Left breast radioactive seed localized excisional biopsy Surgeon:Royalty Fakhouri K Gaither Biehn Anesthesia: General Indications:Marisa Pearson is a 28 y.o. female who is seen today for breast mass follow-up. This is a 28 year old female that I have previously took care of in 2020. She had excision of bilateral axillary accessory breast tissue on 11/28/2018. She has healed well from those areas. The patient states that for her entire adult life, she has noted some indistinct firmness in both upper outer quadrants of her breast. This area of firmness seems larger on the left. The patient had her third child and has been breast-feeding. The child is now almost a year old and she plans to stop breast-feeding soon. Since breast-feeding, this area in the left upper outer quadrant seems more prominent. There is no pain associated with it but the patient is able to easily palpate this area. She underwent ultrasound of this area which showed a 6.5 x 2.5 x 4.2 cm area of a oval circumscribed mixed echogenicity mass. There are multiple focal hypoechoic areas with the largest measuring 2.6 x 1.3 x 1.6 cm. The axillary lymph nodes appear normal. The patient had a biopsy of this left breast mass on 10/05/23 that showed benign breast tissue with adenosis.     Since the last visit 10/03/23, the patient has stopped breast feeding.  The palpable mass in the left upper outer quadrant near the axilla is still palpable and tender when she wears a bra that runs across this area.  To the patient, the mass seems smaller.  She is mostly concerned about the discomfort.    On 01/06/2024, she underwent repeat ultrasound.  This showed no significant change to the mass in the left breast at 2:00, 18 cm from the nipple.  The measurement is 6 x 2.6 x 5.0 cm.  She continues to have almost continuous pain around the mass that sometimes radiates towards  her nipple.  Due to the ongoing discomfort and a persistence of the mass, we recommended excision using radioactive seed localization.  The patient is brought to the operating room placed in supine position on the operating room table. After an adequate level of general anesthesia was obtained, her left breast was prepped with ChloraPrep and draped in sterile fashion. A timeout was taken to ensure the proper patient and proper procedure. We interrogated the breast with the neoprobe.  The mass seed in the left upper outer quadrant near the axilla.  We made a transverse incision over the mass after infiltrating with 0.25% Marcaine . Dissection was carried down in the breast tissue with cautery. We used the neoprobe to guide us  towards the radioactive seed.  We encountered a firm palpable smooth encapsulated breast mass.  We dissected completely around the mass that I removed entirely intact.  The specimen was removed and was oriented with a paint kit. Specimen mammogram showed the radioactive seed as well as the biopsy clip within the specimen. This was sent for pathologic examination. There is no residual radioactivity within the biopsy cavity. We inspected carefully for hemostasis. The wound was thoroughly irrigated. The wound was closed with a deep layer of 3-0 Vicryl and a subcuticular layer of 4-0 Monocryl. Benzoin Steri-Strips were applied. The patient was then extubated and brought to the recovery room in stable condition. All sponge, instrument, and needle counts are correct.  Marisa Pearson. Belinda, MD, [facility]  General Surgery  01/17/2024 11:47 AM

## 2024-01-17 NOTE — Anesthesia Procedure Notes (Signed)
 Procedure Name: LMA Insertion Date/Time: 01/17/2024 11:11 AM  Performed by: Franchot Izetta ORN, CRNAPre-anesthesia Checklist: Patient identified, Emergency Drugs available, Suction available and Patient being monitored Patient Re-evaluated:Patient Re-evaluated prior to induction Oxygen Delivery Method: Circle system utilized Preoxygenation: Pre-oxygenation with 100% oxygen Induction Type: IV induction Ventilation: Mask ventilation without difficulty LMA: LMA inserted LMA Size: 4.0 Number of attempts: 1 Placement Confirmation: positive ETCO2 and breath sounds checked- equal and bilateral Tube secured with: Tape Dental Injury: Teeth and Oropharynx as per pre-operative assessment

## 2024-01-18 ENCOUNTER — Encounter (HOSPITAL_BASED_OUTPATIENT_CLINIC_OR_DEPARTMENT_OTHER): Payer: Self-pay | Admitting: Surgery

## 2024-01-19 LAB — SURGICAL PATHOLOGY

## 2024-01-20 ENCOUNTER — Ambulatory Visit: Payer: Self-pay | Admitting: Surgery

## 2024-01-20 ENCOUNTER — Ambulatory Visit: Admitting: General Practice

## 2024-01-20 NOTE — H&P (Signed)
 History of Present Illness: Marisa Pearson is a 28 y.o. female who is seen today for breast mass follow-up. This is a 28 year old female that I have previously took care of in 2020. She had excision of bilateral axillary accessory breast tissue on 11/28/2018. She has healed well from those areas. The patient states that for her entire adult life, she has noted some indistinct firmness in both upper outer quadrants of her breast. This area of firmness seems larger on the left. The patient had her third child and has been breast-feeding. The child is now almost a year old and she plans to stop breast-feeding soon. Since breast-feeding, this area in the left upper outer quadrant seems more prominent. There is no pain associated with it but the patient is able to easily palpate this area. She underwent ultrasound of this area which showed a 6.5 x 2.5 x 4.2 cm area of a oval circumscribed mixed echogenicity mass. There are multiple focal hypoechoic areas with the largest measuring 2.6 x 1.3 x 1.6 cm. The axillary lymph nodes appear normal. The patient had a biopsy of this left breast mass on 10/05/23 that showed benign breast tissue with adenosis.     Since the last visit 10/03/23, the patient has stopped breast feeding.  The palpable mass in the left upper outer quadrant near the axilla is still palpable and tender when she wears a bra that runs across this area.  To the patient, the mass seems smaller.  She is mostly concerned about the discomfort.    On 01/06/2024, she underwent repeat ultrasound.  This showed no significant change to the mass in the left breast at 2:00, 18 cm from the nipple.  The measurement is 6 x 2.6 x 5.0 cm.  She continues to have almost continuous pain around the mass that sometimes radiates towards her nipple.       Medical History: History reviewed. No pertinent past medical history.      Patient Active Problem List  Diagnosis   Sickle cell trait ()   Prediabetes            Past Surgical History:  Procedure Laterality Date   breast surgery Bilateral 2020    axillary breast tissue           Allergies  Allergen Reactions   Peanut Swelling      SWELLING REACTION UNSPECIFIED   Pork/Porcine Containing Products Other (See Comments)      Muslim, no pork            Current Outpatient Medications on File Prior to Visit  Medication Sig Dispense Refill   copper  intrauterine contraceptive (PARAGARD  T) IUD Insert 1 each into the uterus once (Patient not taking: Reported on 01/09/2024)       fluticasone propionate (FLONASE) 50 mcg/actuation nasal spray Place 2 sprays into both nostrils once daily (Patient not taking: Reported on 01/09/2024) 16 g 5   levocetirizine (XYZAL) 5 MG tablet Take 1 tablet (5 mg total) by mouth every evening (Patient not taking: Reported on 01/09/2024) 30 tablet 5   PHEXXI  1.8-1-0.4 % Gel Administer 1 applicator (5 g) intravaginally immediately before or up to 1 hour before EACH act of vaginal intercourse as needed. (Patient not taking: Reported on 10/03/2023)       prenatal no115/iron/folic acid (PRENATAL 19 ORAL) Take 1 tablet by mouth daily with lunch (Patient not taking: Reported on 10/03/2023)        No current facility-administered medications on file prior  to visit.      History reviewed. No pertinent family history.    Social History       Tobacco Use  Smoking Status Never  Smokeless Tobacco Never      Social History         Socioeconomic History   Marital status: Married  Tobacco Use   Smoking status: Never   Smokeless tobacco: Never  Substance and Sexual Activity   Alcohol use: Never   Drug use: Not Currently   Sexual activity: Yes      Partners: Male      Birth control/protection: Injection    Social Drivers of Health        Financial Resource Strain: Low Risk  (09/19/2023)    Overall Financial Resource Strain (CARDIA)     Difficulty of Paying Living Expenses: Not hard at all  Food Insecurity: No Food  Insecurity (09/19/2023)    Hunger Vital Sign     Worried About Running Out of Food in the Last Year: Never true     Ran Out of Food in the Last Year: Never true  Transportation Needs: No Transportation Needs (09/19/2023)    PRAPARE - Therapist, art (Medical): No     Lack of Transportation (Non-Medical): No  Housing Stability: Low Risk  (09/19/2023)    Housing Stability Vital Sign     Unable to Pay for Housing in the Last Year: No     Number of Times Moved in the Last Year: 0     Homeless in the Last Year: No      Objective:         Vitals:    01/09/24 1018  PainSc: 0-No pain  PainLoc: Breast    Physical Exam    WDWN in NAD Breasts - symmetric, no nipple changes or discharge Both axillary incisions well-healed with no sign of lymphadenopathy The area of palpable firmness in the LUOQ at 2:00 near the axilla is still palpable     Labs, Imaging and Diagnostic Testing:   CLINICAL DATA:  Patient has a tender palpable mass in the upper outer left breast. She was initially evaluated for this on 09/27/2023, where she was found have a fat and soft tissue density mass on mammography and a heterogeneous, oval, circumscribed, 6.5 x 2.5 x 4.2 cm mass ultrasound. This subsequently underwent ultrasound-guided core needle biopsy on 10/05/2023, pathology revealing benign breast tissue with adenosis, with no findings of atypia or malignancy. Overall the mass is characteristic of a hematoma.   EXAM: ULTRASOUND OF THE LEFT BREAST   COMPARISON:  10/27/2023   FINDINGS: Targeted left breast ultrasound is performed, showing a mixed echogenicity oval mass in the left breast at 2 o'clock, 18 cm the nipple, measuring 6 x 2.6 x 5.0 cm, without significant change from the previous ultrasound, still consistent with a benign hamartoma.   IMPRESSION: 1. No evidence of breast malignancy. 2. 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   RECOMMENDATION: 1. Clinical follow-up for the benign left breast hamartoma with consideration of surgical excision for symptom relief.   I have discussed the findings and recommendations with the patient. If applicable, a reminder letter will be sent to the patient regarding the next appointment.   BI-RADS CATEGORY  2: Benign.     Electronically Signed   By: Alm Parkins M.D.   On: 01/06/2024 11:32       Assessment and Plan:  Diagnoses  and all orders for this visit:   Mass of upper outer quadrant of left breast     The palpable mass in the left upper outer quadrant seems to be the same size as it was previously.  Due to the ongoing pain and the persistence of the breast mass, recommend left breast radioactive seed localized lumpectomy.  We will send the specimen for pathology.   The surgical procedure has been discussed with the patient.  Potential risks, benefits, alternative treatments, and expected outcomes have been explained.  All of the patient's questions at this time have been answered.  The likelihood of reaching the patient's treatment goal is good.  The patient understands the proposed surgical procedure and wishes to proceed.       Jalaiya Oyster DEWAYNE LIMA, MD    01/09/2024 10:22 AM

## 2024-01-27 ENCOUNTER — Encounter: Payer: Self-pay | Admitting: General Practice

## 2024-01-30 ENCOUNTER — Encounter: Payer: Self-pay | Admitting: General Practice

## 2024-01-30 ENCOUNTER — Ambulatory Visit: Admitting: General Practice

## 2024-01-30 VITALS — BP 122/82 | HR 83 | Ht 65.5 in | Wt 201.8 lb

## 2024-01-30 DIAGNOSIS — L819 Disorder of pigmentation, unspecified: Secondary | ICD-10-CM | POA: Diagnosis not present

## 2024-01-30 DIAGNOSIS — Z23 Encounter for immunization: Secondary | ICD-10-CM | POA: Diagnosis not present

## 2024-01-30 DIAGNOSIS — N939 Abnormal uterine and vaginal bleeding, unspecified: Secondary | ICD-10-CM | POA: Insufficient documentation

## 2024-01-30 DIAGNOSIS — Z9889 Other specified postprocedural states: Secondary | ICD-10-CM | POA: Diagnosis not present

## 2024-01-30 DIAGNOSIS — Z7689 Persons encountering health services in other specified circumstances: Secondary | ICD-10-CM | POA: Insufficient documentation

## 2024-01-30 MED ORDER — KETOCONAZOLE 2 % EX SHAM: 1.0000 | MEDICATED_SHAMPOO | CUTANEOUS | 0 refills | Status: AC

## 2024-01-30 NOTE — Progress Notes (Signed)
 New Patient Office Visit  Subjective    Patient ID: Marisa Pearson, female    DOB: Dec 06, 1995  Age: 28 y.o. MRN: [id number]  CC:  Chief Complaint  Patient presents with   Establish Care    Pt would like to establish care for her physicals and some questions regarding her birth control side effects and her current birth control. Had side effects from her depo, has the IUD now. Wants to  take it but wants to discuss with provider before she decides. Pt also just had a BREAST LUMPECTOMY    HPI Marisa Pearson is a 28 y.o. female presents to establish care. Previous pcp/physical/labs: Glenda Fields at [facility]; last physical was in 2024.   Discussed the use of AI scribe software for clinical note transcription with the patient, who gave verbal consent to proceed.  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 She underwent a lumpectomy on January 17, 2024, with benign pathology results and is currently healing from the surgery.  She has experienced skin changes and hyperpigmentation, particularly after using the Depo shot. Despite spending significant money on dermatology treatments, there has been no improvement. She switched to a copper  IUD, but the hyperpigmentation persists on her back and chest. She recalls being prescribed minocycline or doxycycline , but is unsure which one. A dermatologist suggested chemical peeling or shampoo as potential treatments. Itching is associated with the hyperpigmentation on her back and chest, but there is no drainage. It is only located on her chest and her back.  She experiences heavy bleeding and pain associated with her copper  IUD, ongoing for four months. She reports no strings are present for her IUD. She has an appt schedule with GYN on Wednesday to get the IUD removed and will discuss other birth control options  then. Given her age, she does not want to consider surgical interventions for birth control.    Outpatient Encounter Medications as of 01/30/2024  Medication Sig   clindamycin  (CLINDAGEL) 1 % gel Apply topically at bedtime.   fluticasone (FLONASE) 50 MCG/ACT nasal spray Place 2 sprays into both nostrils daily.   ibuprofen  (ADVIL ) 600 MG tablet TAKE 1 TABLET BY MOUTH EVERY 6 (SIX) HOURS AS NEEDED FOR HEADACHE, MILD PAIN OR MODERATE PAIN.   ketoconazole (NIZORAL) 2 % shampoo Apply 1 Application topically 2 (two) times a week.   PARAGARD  INTRAUTERINE COPPER  IU by Intrauterine route.   [DISCONTINUED] levocetirizine (XYZAL) 5 MG tablet Take 5 mg by mouth every evening. (Patient not taking: Reported on 01/30/2024)   [DISCONTINUED] nystatin  cream (MYCOSTATIN ) Apply 1 Application topically 2 (two) times daily. (Patient not taking: Reported on 01/30/2024)   No facility-administered encounter medications on file as of 01/30/2024.    Past Medical History:  Diagnosis Date   Sickle cell trait 04/13/2022    Past Surgical History:  Procedure Laterality Date   BREAST BIOPSY Left 10/05/2023   u/s heart clip path pending   BREAST BIOPSY Left 10/05/2023   US  LT BREAST BX W LOC DEV 1ST LESION IMG BX SPEC US  GUIDE 10/05/2023 [facility]   BREAST BIOPSY Left 01/13/2024   US  LT RADIOACTIVE SEED LOC 01/13/2024 [facility]   BREAST LUMPECTOMY WITH RADIOACTIVE SEED LOCALIZATION Left 01/17/2024   Procedure: BREAST LUMPECTOMY WITH RADIOACTIVE SEED LOCALIZATION;  Surgeon: Belinda Cough, MD;  Location: [facility];  Service: General;  Laterality: Left;  LEFT BREAST RADIOACTIVE SEED LOCALIZED LUMPECTOMY   EXCISION OF BREAST BIOPSY Bilateral 11/28/2018   Procedure: EXCISION OF BILATERAL AXILLARY ACCESSORY BREAST TISSUE;  Surgeon: Belinda Cough, MD;  Location: [facility];  Service: General;  Laterality: Bilateral;    Family History  Problem Relation Age of Onset   Hypertension Maternal  Grandmother    Hypertension Maternal Grandfather     Social History   Socioeconomic History   Marital status: Married    Spouse name: Not on file   Number of children: Not on file   Years of education: Not on file   Highest education level: Not on file  Occupational History   Not on file  Tobacco Use   Smoking status: Never   Smokeless tobacco: Never   Tobacco comments:    Never smoked  Vaping Use   Vaping status: Never Used  Substance and Sexual Activity   Alcohol use: Never   Drug use: Never   Sexual activity: Yes    Birth control/protection: I.U.D.  Other Topics Concern   Not on file  Social History Narrative   Not on file   Social Drivers of Health   Financial Resource Strain: Low Risk  (09/19/2023)   Received from [facility]   Overall Financial Resource Strain (CARDIA)    Difficulty of Paying Living Expenses: Not hard at all  Food Insecurity: No Food Insecurity (09/19/2023)   Received from [facility]   Hunger Vital Sign    Within the past 12 months, you worried that your food would run out before you got the money to buy more.: Never true    Within the past 12 months, the food you bought just didn't last and you didn't have money to get more.: Never true  Transportation Needs: No Transportation Needs (09/19/2023)   Received from [facility] - Transportation    In the past 12 months, has lack of transportation kept you from medical appointments or from getting medications?: No    Lack of Transportation (Non-Medical): No  Physical Activity: Not on file  Stress: Not on file  Social Connections: Not on file  Intimate Partner Violence: Not on file    Review of Systems  Constitutional:  Negative for chills and fever.  Respiratory:  Negative for shortness of breath.   Cardiovascular:  Negative for chest pain.  Gastrointestinal:  Negative for abdominal pain, constipation, diarrhea, heartburn, nausea  and vomiting.  Genitourinary:  Negative for dysuria, frequency and urgency.  Neurological:  Negative for dizziness and headaches.  Endo/Heme/Allergies:  Negative for polydipsia.  Psychiatric/Behavioral:  Negative for depression and suicidal ideas. The patient is not nervous/anxious.         Objective    BP 122/82   Pulse 83   Ht 5' 5.5 (1.664 m)   Wt 201 lb 12.8 oz (91.5 kg)   LMP 01/10/2024   SpO2 98%   BMI 33.07 kg/m   Physical Exam Vitals and nursing note reviewed.  Constitutional:      Appearance: Normal appearance.  Cardiovascular:     Rate and Rhythm: Normal rate and regular rhythm.     Pulses: Normal pulses.     Heart sounds: Normal heart sounds.  Pulmonary:     Effort: Pulmonary effort is normal.     Breath sounds: Normal breath sounds.  Skin:     Neurological:     Mental Status: She is alert and oriented to person, place, and time.  Psychiatric:        Mood and Affect: Mood normal.        Behavior: Behavior normal.        Thought Content: Thought content normal.        Judgment: Judgment normal.         Assessment & Plan:  Hyperpigmentation -     Ambulatory referral to Dermatology -     Ketoconazole; Apply 1 Application topically 2 (two) times a week.  Dispense: 120 mL; Refill: 0  Establishing care with new doctor, encounter for  Immunization due -     Flu vaccine trivalent PF, 6mos and older(Flulaval,Afluria,Fluarix,Fluzone)  Abnormal uterine bleeding  S/P lumpectomy, left breast    Assessment and Plan Assessment & Plan Post-surgical follow-up for left breast lumpectomy -Healing phase post-lumpectomy for benign mass.  Abnormal uterine bleeding with copper  IUD Heavy bleeding and pain post-IUD insertion. IUD strings not palpable. Prefers non-hormonal contraception. - Consult gynecologist for IUD issues and bleeding. - Consider spermicide or partner vasectomy.  Hyperpigmentation secondary to Depo-Provera  use Persistent hyperpigmentation  post-Depo-Provera . Previous treatments unsuccessful. Advised hyperpigmentation may be untreatable. - Refer to dermatologist. - Prescribe ketoconazole shampoo twice weekly.  Follow-up Scheduled follow-up for physical exam and blood work. Concerns about sleep apnea to be addressed. - Schedule follow-up in four weeks. - Advise fasting before blood work. - Discuss potential sleep apnea and consider sleep study.    Return in about 4 weeks (around 02/27/2024) for physical and fasting labs.SABRA Carrol Aurora, NP

## 2024-01-30 NOTE — Patient Instructions (Addendum)
 You will either be contacted via phone regarding your referral to dermatology , or you may receive a letter on your MyChart portal from our referral team with instructions for scheduling an appointment. Please let us  know if you have not been contacted by anyone within two weeks.   Start the shampoo twice a week.   Discuss the abnormal bleeding and IUD with GYN.   Follow up in 4 weeks for physical and fasting labs.   It was a pleasure to meet you today! Please don't hesitate to contact me with any questions. Welcome to [facility]!

## 2024-02-08 ENCOUNTER — Ambulatory Visit: Admitting: Certified Nurse Midwife

## 2024-02-08 ENCOUNTER — Encounter: Payer: Self-pay | Admitting: Certified Nurse Midwife

## 2024-02-08 VITALS — BP 120/74 | HR 92

## 2024-02-08 DIAGNOSIS — Z3009 Encounter for other general counseling and advice on contraception: Secondary | ICD-10-CM

## 2024-02-08 DIAGNOSIS — Z30432 Encounter for removal of intrauterine contraceptive device: Secondary | ICD-10-CM | POA: Diagnosis not present

## 2024-02-08 DIAGNOSIS — T8332XD Displacement of intrauterine contraceptive device, subsequent encounter: Secondary | ICD-10-CM

## 2024-02-08 DIAGNOSIS — L819 Disorder of pigmentation, unspecified: Secondary | ICD-10-CM

## 2024-02-08 DIAGNOSIS — D649 Anemia, unspecified: Secondary | ICD-10-CM | POA: Diagnosis not present

## 2024-02-08 LAB — CBC
Hematocrit: 41.1 % (ref 34.0–46.6)
Hemoglobin: 13.2 g/dL (ref 11.1–15.9)
MCH: 27.6 pg (ref 26.6–33.0)
MCHC: 32.1 g/dL (ref 31.5–35.7)
MCV: 86 fL (ref 79–97)
Platelets: 282 x10E3/uL (ref 150–450)
RBC: 4.79 x10E6/uL (ref 3.77–5.28)
RDW: 13.1 % (ref 11.7–15.4)
WBC: 7.2 x10E3/uL (ref 3.4–10.8)

## 2024-02-08 MED ORDER — DOXYCYCLINE HYCLATE 100 MG PO CAPS
100.0000 mg | ORAL_CAPSULE | Freq: Two times a day (BID) | ORAL | 0 refills | Status: AC
Start: 2024-02-08 — End: ?

## 2024-02-08 NOTE — Progress Notes (Signed)
    GYNECOLOGY CLINIC PROCEDURE NOTE  Ms. Beryl Hornberger Gracia Fuss is a 28 y.o. [phone] here for Paragard  IUD removal. No GYN concerns.  Last pap smear was on 09/06/2023 and was normal.  Patient reports pain with intercourse and AUB. Reviewed that IUD may not be the cause of AUB given the non-hormonal nature of Paragard . Patient still desires removal today. On last visit IUD strings were unable to be visualized and placement confirmed on US .   IUD Removal  Patient was in the dorsal lithotomy position, normal external genitalia was noted.  A speculum was placed in the patient's vagina, normal discharge was noted, no lesions. The multiparous cervix was visualized, no lesions, no abnormal discharge.    IUD Removal Attempt Patient identified, informed consent performed, consent signed.  Patient was in the dorsal lithotomy position, normal external genitalia was noted.  A speculum was placed in the patient's vagina, normal discharge was noted, no lesions. The cervix was visualized, no lesions, no abnormal discharge.  The strings of the IUD were not visualized, and unable to palpated using endocervical brush. Abdominal Ultrasound introduced to confirm placement. IUD noted to be in the correct location at the top of the fundus. Cervix was swabbed with betadine x 2.  Kelly forceps were introduced into the endometrial cavity and the IUD was palpated, and noted to be very slippery. This was unable to be grasped.Tried two different IUD hooks a few times, no success. At this point, there was some bleeding which made palpation of the IUD more difficult and I could not grasp it. Bleeding cleared from vaginal vault and CNM was able to visualized the tip of IUD strings. Strings then teased again with cyto-brush and CNM was able to grasp enough strings with [employer] and IUD easily removed in its entirety. Patient tolerated procedure well.    Meds ordered this encounter  Medications   doxycycline  (VIBRAMYCIN ) 100 MG  capsule    Sig: Take 1 capsule (100 mg total) by mouth 2 (two) times daily.    Dispense:  28 capsule    Refill:  0    Supervising Provider:   PRATT, TANYA S [[id number]]    Patient also requesting a referral for hyperpigmentation and discoloration of the skin         1. Encounter for IUD removal   2. Birth control counseling   3. Intrauterine contraceptive device threads lost, subsequent encounter   4. Anemia, unspecified type   5. Discoloration of skin    - Reviewed bleeding and cramping expectations given the difficulty in removal.  - Recommended the use of over the counter Ibuprofen  for cramping. Also ordered a short course of antibiotics.  - Recommendation to return to Office if pain and AUB continues despite IUD removal.  - Planning to use condoms as contraceptive management.   - Dermatology referral placed to derm for hyperpigmentation and discoloration.     Clydie Dillen Erven) Emilio, MSN, CNM  [facility]  02/08/2024 10:56 AM

## 2024-02-23 ENCOUNTER — Ambulatory Visit

## 2024-02-29 ENCOUNTER — Ambulatory Visit: Payer: Self-pay | Admitting: General Practice

## 2024-02-29 ENCOUNTER — Ambulatory Visit (INDEPENDENT_AMBULATORY_CARE_PROVIDER_SITE_OTHER): Admitting: General Practice

## 2024-02-29 VITALS — BP 118/80 | HR 84 | Temp 98.4°F | Ht 65.5 in | Wt 201.0 lb

## 2024-02-29 DIAGNOSIS — Z0000 Encounter for general adult medical examination without abnormal findings: Secondary | ICD-10-CM

## 2024-02-29 DIAGNOSIS — E66811 Obesity, class 1: Secondary | ICD-10-CM

## 2024-02-29 DIAGNOSIS — Z6832 Body mass index (BMI) 32.0-32.9, adult: Secondary | ICD-10-CM

## 2024-02-29 DIAGNOSIS — Z13220 Encounter for screening for lipoid disorders: Secondary | ICD-10-CM | POA: Diagnosis not present

## 2024-02-29 DIAGNOSIS — E6609 Other obesity due to excess calories: Secondary | ICD-10-CM | POA: Diagnosis not present

## 2024-02-29 LAB — CBC
HCT: 38.1 % (ref 36.0–46.0)
Hemoglobin: 12.7 g/dL (ref 12.0–15.0)
MCHC: 33.3 g/dL (ref 30.0–36.0)
MCV: 81.9 fl (ref 78.0–100.0)
Platelets: 292 K/uL (ref 150.0–400.0)
RBC: 4.66 Mil/uL (ref 3.87–5.11)
RDW: 14 % (ref 11.5–15.5)
WBC: 8.3 K/uL (ref 4.0–10.5)

## 2024-02-29 LAB — COMPREHENSIVE METABOLIC PANEL WITH GFR
ALT: 20 U/L (ref 0–35)
AST: 15 U/L (ref 0–37)
Albumin: 4.3 g/dL (ref 3.5–5.2)
Alkaline Phosphatase: 61 U/L (ref 39–117)
BUN: 8 mg/dL (ref 6–23)
CO2: 28 meq/L (ref 19–32)
Calcium: 9.6 mg/dL (ref 8.4–10.5)
Chloride: 104 meq/L (ref 96–112)
Creatinine, Ser: 0.5 mg/dL (ref 0.40–1.20)
GFR: 127.6 mL/min (ref >60.00)
Glucose, Bld: 70 mg/dL (ref 70–99)
Potassium: 4.1 meq/L (ref 3.5–5.1)
Sodium: 138 meq/L (ref 135–145)
Total Bilirubin: 0.3 mg/dL (ref 0.2–1.2)
Total Protein: 7.1 g/dL (ref 6.0–8.3)

## 2024-02-29 LAB — LIPID PANEL
Cholesterol: 148 mg/dL (ref 0–200)
HDL: 47.5 mg/dL (ref >39.00)
LDL Cholesterol: 84 mg/dL (ref 0–99)
NonHDL: 100.74
Total CHOL/HDL Ratio: 3
Triglycerides: 83 mg/dL (ref 0.0–149.0)
VLDL: 16.6 mg/dL (ref 0.0–40.0)

## 2024-02-29 LAB — HEMOGLOBIN A1C: Hgb A1c MFr Bld: 5.7 % (ref 4.6–6.5)

## 2024-02-29 LAB — TSH: TSH: 0.64 u[IU]/mL (ref 0.35–5.50)

## 2024-02-29 NOTE — Patient Instructions (Signed)
 Stop by the lab prior to leaving today. I will notify you of your results once received.   Schedule appt to discuss urine and vaginal discharge.   It was a pleasure to see you today!

## 2024-02-29 NOTE — Assessment & Plan Note (Signed)
 Immunizations UTD. Pap smear UTD.  Discussed the importance of a healthy diet and regular exercise in order for weight loss, and to reduce the risk of further co-morbidity.  Exam stable. Labs pending.  Follow up in 1 year for repeat physical.

## 2024-02-29 NOTE — Progress Notes (Signed)
 Established Patient Office Visit  Subjective   Patient ID: Marisa Pearson, female    DOB: Nov 28, 1995  Age: 28 y.o. MRN: [id number]  Chief Complaint  Patient presents with   Annual Exam    With fasting labs    HPI  Marisa Pearson is a 28 year old female with past medical history of prediabetes presents today for complete physical and follow up of chronic conditions.  Immunizations: -Tetanus: Completed in 2016 -Influenza: completed this season  Diet: Fair diet.  Exercise: No regular exercise.  Eye exam: scheduled  Dental exam: Completes semi-annually    Pap Smear: Completed in 2025   Patient Active Problem List   Diagnosis Date Noted   Encounter for screening and preventative care 02/29/2024   Class 1 obesity due to excess calories with body mass index (BMI) of 32.0 to 32.9 in adult 02/29/2024   Past Medical History:  Diagnosis Date   Sickle cell trait 04/13/2022   Past Surgical History:  Procedure Laterality Date   BREAST BIOPSY Left 10/05/2023   u/s heart clip path pending   BREAST BIOPSY Left 10/05/2023   US  LT BREAST BX W LOC DEV 1ST LESION IMG BX SPEC US  GUIDE 10/05/2023 [facility]   BREAST BIOPSY Left 01/13/2024   US  LT RADIOACTIVE SEED LOC 01/13/2024 [facility]   BREAST LUMPECTOMY WITH RADIOACTIVE SEED LOCALIZATION Left 01/17/2024   Procedure: BREAST LUMPECTOMY WITH RADIOACTIVE SEED LOCALIZATION;  Surgeon: Belinda Cough, MD;  Location: [facility];  Service: General;  Laterality: Left;  LEFT BREAST RADIOACTIVE SEED LOCALIZED LUMPECTOMY   EXCISION OF BREAST BIOPSY Bilateral 11/28/2018   Procedure: EXCISION OF BILATERAL AXILLARY ACCESSORY BREAST TISSUE;  Surgeon: Belinda Cough, MD;  Location: [facility];  Service: General;  Laterality: Bilateral;   Allergies  Allergen Reactions   Peanut-Containing Drug Products Swelling    SWELLING REACTION UNSPECIFIED    Porcine (Pork) Protein-Containing Drug Products Other (See  Comments)    Muslim, no pork         01/30/2024   10:32 AM 09/24/2022   10:20 AM 04/02/2022   11:32 AM  Depression screen PHQ 2/9  Decreased Interest 3 3 3   Down, Depressed, Hopeless 0 0 0  PHQ - 2 Score 3 3 3   Altered sleeping 1 1 0  Tired, decreased energy 2 3 1   Change in appetite 2 1 1   Feeling bad or failure about yourself  0 0 0  Trouble concentrating 0 0 0  Moving slowly or fidgety/restless 0 0 0  Suicidal thoughts 0 0 0  PHQ-9 Score 8  8  5    Difficult doing work/chores  Not difficult at all Not difficult at all     Data saved with a previous flowsheet row definition       01/30/2024   10:32 AM 09/24/2022   10:21 AM 04/02/2022   11:34 AM  GAD 7 : Generalized Anxiety Score  Nervous, Anxious, on Edge 0 0 0  Control/stop worrying 0 0 0  Worry too much - different things 0 0 0  Trouble relaxing 0 1 0  Restless 0 0 0  Easily annoyed or irritable 0 0 0  Afraid - awful might happen 0 0 0  Total GAD 7 Score 0 1 0  Anxiety Difficulty   Not difficult at all      Review of Systems  Constitutional:  Negative for chills, fever, malaise/fatigue and weight loss.  HENT:  Negative for congestion, ear discharge, ear  pain, hearing loss, nosebleeds, sinus pain, sore throat and tinnitus.   Eyes:  Negative for blurred vision, double vision, pain, discharge and redness.  Respiratory:  Negative for cough, shortness of breath, wheezing and stridor.   Cardiovascular:  Negative for chest pain, palpitations and leg swelling.  Gastrointestinal:  Negative for abdominal pain, constipation, diarrhea, heartburn, nausea and vomiting.  Genitourinary:  Negative for dysuria, frequency and urgency.  Musculoskeletal:  Negative for myalgias.  Skin:  Negative for rash.  Neurological:  Negative for dizziness, tingling, seizures, weakness and headaches.  Endo/Heme/Allergies:  Negative for polydipsia.  Psychiatric/Behavioral:  Negative for depression, substance abuse and suicidal ideas. The patient  is not nervous/anxious.       Objective:     BP 118/80   Pulse 84   Temp 98.4 F (36.9 C) (Temporal)   Ht 5' 5.5 (1.664 m)   Wt 201 lb (91.2 kg)   LMP 02/16/2024 (Exact Date)   SpO2 98%   BMI 32.94 kg/m  BP Readings from Last 3 Encounters:  02/29/24 118/80  02/08/24 120/74  01/30/24 122/82   Wt Readings from Last 3 Encounters:  02/29/24 201 lb (91.2 kg)  01/30/24 201 lb 12.8 oz (91.5 kg)  01/17/24 201 lb 11.5 oz (91.5 kg)      Physical Exam Vitals and nursing note reviewed.  Constitutional:      Appearance: Normal appearance.  HENT:     Head: Normocephalic and atraumatic.     Right Ear: Tympanic membrane, ear canal and external ear normal.     Left Ear: Tympanic membrane, ear canal and external ear normal.     Nose: Congestion present.     Mouth/Throat:     Mouth: Mucous membranes are moist.     Pharynx: Oropharynx is clear.  Eyes:     Conjunctiva/sclera: Conjunctivae normal.     Pupils: Pupils are equal, round, and reactive to light.  Cardiovascular:     Rate and Rhythm: Normal rate and regular rhythm.     Pulses: Normal pulses.     Heart sounds: Normal heart sounds.  Pulmonary:     Effort: Pulmonary effort is normal.     Breath sounds: Normal breath sounds.  Abdominal:     General: Abdomen is flat. Bowel sounds are normal.     Palpations: Abdomen is soft.  Musculoskeletal:        General: Normal range of motion.     Cervical back: Normal range of motion.  Skin:    General: Skin is warm and dry.     Capillary Refill: Capillary refill takes less than 2 seconds.  Neurological:     General: No focal deficit present.     Mental Status: She is alert and oriented to person, place, and time. Mental status is at baseline.  Psychiatric:        Mood and Affect: Mood normal.        Behavior: Behavior normal.        Thought Content: Thought content normal.        Judgment: Judgment normal.      No results found for any visits on 02/29/24.     The  ASCVD Risk score (Arnett DK, et al., 2019) failed to calculate for the following reasons:   The 2019 ASCVD risk score is only valid for ages 51 to 36    Assessment & Plan:  Encounter for screening and preventative care Assessment & Plan: Immunizations UTD. Pap smear UTD.  Discussed the importance of  a healthy diet and regular exercise in order for weight loss, and to reduce the risk of further co-morbidity.  Exam stable. Labs pending.  Follow up in 1 year for repeat physical.   Orders: -     CBC -     Comprehensive metabolic panel with GFR -     Lipid panel -     TSH  Class 1 obesity due to excess calories with body mass index (BMI) of 32.0 to 32.9 in adult, unspecified whether serious comorbidity present Assessment & Plan: Discussed the importance of healthy diet and exercise to affect sustainable weight loss.    Orders: -     Hemoglobin A1c     Return in about 1 year (around 02/28/2025) for physical and fasting labs.    Carrol Aurora, NP

## 2024-02-29 NOTE — Assessment & Plan Note (Signed)
 Discussed the importance of healthy diet and exercise to affect sustainable weight loss.

## 2024-03-08 DIAGNOSIS — H52223 Regular astigmatism, bilateral: Secondary | ICD-10-CM | POA: Diagnosis not present

## 2024-04-13 ENCOUNTER — Other Ambulatory Visit

## 2024-05-16 ENCOUNTER — Ambulatory Visit: Admitting: Certified Nurse Midwife

## 2024-09-12 ENCOUNTER — Ambulatory Visit: Admitting: Dermatology

## 2025-03-05 ENCOUNTER — Encounter: Admitting: General Practice
